# Patient Record
Sex: Female | Born: 1935
Health system: Southern US, Community
[De-identification: ages and names within clinical notes are randomized; demographics above are authoritative.]

## PROBLEM LIST (undated history)

## (undated) DIAGNOSIS — M1990 Unspecified osteoarthritis, unspecified site: Secondary | ICD-10-CM

## (undated) DIAGNOSIS — Z923 Personal history of irradiation: Secondary | ICD-10-CM

## (undated) DIAGNOSIS — N63 Unspecified lump in unspecified breast: Secondary | ICD-10-CM

## (undated) DIAGNOSIS — D689 Coagulation defect, unspecified: Secondary | ICD-10-CM

## (undated) DIAGNOSIS — C50919 Malignant neoplasm of unspecified site of unspecified female breast: Secondary | ICD-10-CM

## (undated) DIAGNOSIS — H269 Unspecified cataract: Secondary | ICD-10-CM

## (undated) DIAGNOSIS — I10 Essential (primary) hypertension: Secondary | ICD-10-CM

## (undated) DIAGNOSIS — M810 Age-related osteoporosis without current pathological fracture: Secondary | ICD-10-CM

## (undated) HISTORY — DX: Malignant neoplasm of unspecified site of unspecified female breast: C50.919

## (undated) HISTORY — DX: Essential (primary) hypertension: I10

## (undated) HISTORY — DX: Unspecified cataract: H26.9

## (undated) HISTORY — DX: Coagulation defect, unspecified: D68.9

## (undated) HISTORY — DX: Unspecified osteoarthritis, unspecified site: M19.90

## (undated) HISTORY — PX: EYE SURGERY: SHX253

## (undated) HISTORY — DX: Age-related osteoporosis without current pathological fracture: M81.0

---

## 2004-04-20 HISTORY — PX: OTHER SURGICAL HISTORY: SHX169

## 2016-12-08 ENCOUNTER — Encounter: Payer: Self-pay | Admitting: Family

## 2016-12-08 ENCOUNTER — Ambulatory Visit (INDEPENDENT_AMBULATORY_CARE_PROVIDER_SITE_OTHER): Payer: Medicaid Other | Admitting: Family

## 2016-12-08 VITALS — BP 141/54 | HR 64 | Temp 98.4°F | Ht 59.0 in | Wt 104.0 lb

## 2016-12-08 DIAGNOSIS — M5410 Radiculopathy, site unspecified: Secondary | ICD-10-CM

## 2016-12-08 DIAGNOSIS — I10 Essential (primary) hypertension: Secondary | ICD-10-CM

## 2016-12-08 DIAGNOSIS — E785 Hyperlipidemia, unspecified: Secondary | ICD-10-CM

## 2016-12-08 DIAGNOSIS — Z86718 Personal history of other venous thrombosis and embolism: Secondary | ICD-10-CM | POA: Diagnosis not present

## 2016-12-08 DIAGNOSIS — H9191 Unspecified hearing loss, right ear: Secondary | ICD-10-CM

## 2016-12-08 DIAGNOSIS — H9190 Unspecified hearing loss, unspecified ear: Secondary | ICD-10-CM

## 2016-12-08 LAB — COMPREHENSIVE METABOLIC PANEL
ALBUMIN: 3.7 g/dL (ref 3.5–5.2)
ALK PHOS: 53 U/L (ref 39–117)
ALT: 11 U/L (ref 0–35)
AST: 19 U/L (ref 0–37)
BUN: 15 mg/dL (ref 6–23)
CALCIUM: 9.5 mg/dL (ref 8.4–10.5)
CO2: 30 mEq/L (ref 19–32)
CREATININE: 0.71 mg/dL (ref 0.40–1.20)
Chloride: 98 mEq/L (ref 96–112)
GFR: 83.17 mL/min (ref >60.00)
Glucose, Bld: 92 mg/dL (ref 70–99)
POTASSIUM: 4.3 meq/L (ref 3.5–5.1)
SODIUM: 131 meq/L — AB (ref 135–145)
TOTAL PROTEIN: 7.8 g/dL (ref 6.0–8.3)
Total Bilirubin: 0.5 mg/dL (ref 0.2–1.2)

## 2016-12-08 LAB — LIPID PANEL
CHOLESTEROL: 126 mg/dL (ref 0–200)
HDL: 56.5 mg/dL (ref >39.00)
LDL Cholesterol: 56 mg/dL (ref 0–99)
NonHDL: 69.71
Total CHOL/HDL Ratio: 2
Triglycerides: 70 mg/dL (ref 0.0–149.0)
VLDL: 14 mg/dL (ref 0.0–40.0)

## 2016-12-08 NOTE — Progress Notes (Signed)
Subjective:    Patient ID: Tanya Sparks, female    DOB: 1930-03-08, 83 y.o.   MRN: [id number]  HPI  Tanya Sparks is an 83 yr old female who moved here from [place] recently. She lives with her Tanya Sparks.  HTN- maintained on Hyzaar.  Hearing difficulty. Has trouble hearing television and conversation.    Grandson reports that when pt ws in [place] she had a "clot" in her head.  Reports that she has been taking a baby aspirin. This occurred in 2004. It almost sounds like a temporal artery clot. She states that it was not a stroke.    Cataracts- had removal 1991 and 1993  Reports fall in 2011 and injured her back at that time.   Review of Systems  Constitutional: Negative for unexpected weight change.  HENT: Positive for hearing loss and rhinorrhea.   Eyes: Negative for visual disturbance.  Respiratory: Negative for cough.   Cardiovascular: Negative for leg swelling.  Gastrointestinal: Negative for diarrhea.       Occasional constipation  Genitourinary: Negative for dysuria and frequency.  Musculoskeletal: Negative for arthralgias and myalgias.       Reports some tingling left anterior thigh.  Has some right sided back pain.  biofreeze roll on helps  Skin: Negative for rash.  Neurological: Negative for headaches.  Hematological: Negative for adenopathy.  Psychiatric/Behavioral:       Denies depression       Past Medical History:  Diagnosis Date  . Arthritis   . Cataract   . Clotting disorder ([place])   . Hypertension      Social History   Social History  . Marital status: Widowed    Spouse name: N/A  . Number of children: N/A  . Years of education: N/A   Occupational History  . Not on file.   Social History Main Topics  . Smoking status: Never Smoker  . Smokeless tobacco: Never Used  . Alcohol use No  . Drug use: No  . Sexual activity: No   Other Topics Concern  . Not on file   Social History Narrative   4 grown children   Widowed   Retired  housewife   Completed 5th grade in [place], she is literate in her native language   Lives with grandson, his wife and their daughter   No pets.    Past Surgical History:  Procedure Laterality Date  . clot removal Right 2006   Pt states she had a blood clot removed from her temple about 12 years ago.  Marland Kitchen EYE SURGERY      Family History  Problem Relation Age of Onset  . Diabetes Father     Not on File  No current outpatient prescriptions on file prior to visit.   No current facility-administered medications on file prior to visit.     BP (!) 141/54 (Cuff Size: Normal)   Pulse 64   Temp 98.4 F (36.9 C) (Oral)   Ht 4\' 11"  (1.499 m)   Wt 104 lb (47.2 kg)   SpO2 98%   BMI 21.01 kg/m    Objective:   Physical Exam  Constitutional: She is oriented to person, place, and time. She appears well-developed and well-nourished.  HENT:  Head: Normocephalic and atraumatic.  Eyes: No scleral icterus.  Cardiovascular: Normal rate, regular rhythm and normal heart sounds.   No murmur heard. Pulmonary/Chest: Effort normal and breath sounds normal. No respiratory distress. She has no wheezes.  Musculoskeletal: She exhibits no edema.  Neurological: She is alert and oriented to person, place, and time.  Skin: Skin is warm and dry.  Psychiatric: She has a normal mood and affect. Her behavior is normal. Judgment and thought content normal.          Assessment & Plan:  Hearing problem- will refer to audiology.   Back pain with radiculopathy- has mild tingling. Advised prn tylenol.  OK to use biofreeze prn.

## 2016-12-08 NOTE — Assessment & Plan Note (Signed)
bp at goal for her age on hyzaar. Continue same, obtain follow up bmet.

## 2016-12-08 NOTE — Patient Instructions (Addendum)
Please complete lab work prior to leaving.  Welcome to [facility]! 

## 2017-03-15 ENCOUNTER — Ambulatory Visit (INDEPENDENT_AMBULATORY_CARE_PROVIDER_SITE_OTHER): Payer: Medicaid Other | Admitting: Family

## 2017-03-15 ENCOUNTER — Encounter: Payer: Self-pay | Admitting: Family

## 2017-03-15 VITALS — BP 150/65 | HR 76 | Temp 98.6°F | Resp 16 | Ht 59.0 in | Wt 104.6 lb

## 2017-03-15 DIAGNOSIS — E871 Hypo-osmolality and hyponatremia: Secondary | ICD-10-CM

## 2017-03-15 DIAGNOSIS — Z0000 Encounter for general adult medical examination without abnormal findings: Secondary | ICD-10-CM

## 2017-03-15 DIAGNOSIS — E348 Other specified endocrine disorders: Secondary | ICD-10-CM

## 2017-03-15 DIAGNOSIS — N63 Unspecified lump in unspecified breast: Secondary | ICD-10-CM

## 2017-03-15 NOTE — Progress Notes (Signed)
Subjective:    Patient ID: Tanya Sparks First, female    DOB: [birth year], 83 y.o.   MRN: [id number]  HPI  Patient presents today for complete physical.  Immunizations: flu shot end of September, reports tetanus <10 years, prevnar today Diet: healthy, vegetarian Exercise:walks frequently Colonoscopy: declines work up due to advanced age Dexa: due Pap Smear: N/A due to advanced age 61: concerned about a lump in her left breast.    Yolanda Bonine helps with translation.  Patient reports that she has had a mass in the left breast for some time, "it doesn't hurt."    Review of Systems  Constitutional: Negative for unexpected weight change.  HENT: Positive for hearing loss. Negative for rhinorrhea.        Considering hearing aids  Eyes: Negative for visual disturbance.  Respiratory: Negative for cough.   Cardiovascular: Negative for leg swelling.  Gastrointestinal: Negative for blood in stool, constipation and diarrhea.  Genitourinary: Negative for dysuria and frequency.  Musculoskeletal:       Occasional knee pain  Skin: Negative for rash.  Neurological: Negative for headaches.  Hematological: Negative for adenopathy.  Psychiatric/Behavioral:       Denies depression/anxiety   Past Medical History:  Diagnosis Date  . Arthritis   . Cataract   . Clotting disorder ([place])   . Hypertension      Social History   Socioeconomic History  . Marital status: Widowed    Spouse name: Not on file  . Number of children: Not on file  . Years of education: Not on file  . Highest education level: Not on file  Social Needs  . Financial resource strain: Not on file  . Food insecurity - worry: Not on file  . Food insecurity - inability: Not on file  . Transportation needs - medical: Not on file  . Transportation needs - non-medical: Not on file  Occupational History  . Not on file  Tobacco Use  . Smoking status: Never Smoker  . Smokeless tobacco: Never Used  Substance and Sexual  Activity  . Alcohol use: No  . Drug use: No  . Sexual activity: No  Other Topics Concern  . Not on file  Social History Narrative   4 grown children   Widowed   Retired housewife   Completed 5th grade in [place], she is literate in her native language   Lives with grandson, his wife and their daughter   No pets.    Past Surgical History:  Procedure Laterality Date  . clot removal Right 2006   Pt states she had a blood clot removed from her temple about 12 years ago.  Marland Kitchen EYE SURGERY      Family History  Problem Relation Age of Onset  . Diabetes Father     Not on File  Current Outpatient Medications on File Prior to Visit  Medication Sig Dispense Refill  . aspirin (ECOTRIN LOW STRENGTH) 81 MG EC tablet Take 81 mg by mouth daily. Swallow whole.    . losartan-hydrochlorothiazide (HYZAAR) 50-12.5 MG tablet Take 1 tablet by mouth daily.    . Multiple Vitamin (MULTIVITAMIN WITH MINERALS) TABS tablet Take 1 tablet by mouth daily.     No current facility-administered medications on file prior to visit.     BP (!) 168/50 (BP Location: Left Arm, Patient Position: Sitting, Cuff Size: Small)   Pulse 76   Temp 98.6 F (37 C) (Oral)   Resp 16   Ht 4\' 11"  (1.499 m)  Wt 104 lb 9.6 oz (47.4 kg)   SpO2 99%   BMI 21.13 kg/m       Objective:   Physical Exam Physical Exam  Constitutional: She is oriented to person, place, and time. She appears well-developed and well-nourished. No distress.  HENT:  Head: Normocephalic and atraumatic.  Right Ear: Tympanic membrane and ear canal normal.  Left Ear: Tympanic membrane and ear canal normal.  Mouth/Throat: Oropharynx is clear and moist.  Eyes: Pupils are equal, round, and reactive to light. No scleral icterus.  Neck: Normal range of motion. No thyromegaly present.  Cardiovascular: Normal rate and regular rhythm.   No murmur heard. Pulmonary/Chest: Effort normal and breath sounds normal. No respiratory distress. He has no wheezes. She  has no rales. She exhibits no tenderness.  Abdominal: Soft. Bowel sounds are normal. She exhibits no distension and no mass. There is no tenderness. There is no rebound and no guarding.  Musculoskeletal: She exhibits no edema.  Lymphadenopathy:    She has no cervical adenopathy.  Neurological: She is alert and oriented to person, place, and time. She has normal patellar reflexes. She exhibits normal muscle tone. Coordination normal.  Skin: Skin is warm and dry.  Psychiatric: She has a normal mood and affect. Her behavior is normal. Judgment and thought content normal.  Breasts: Examined lying Right: Without masses, retractions, discharge or axillary adenopathy.  Left: +mass 12 o'clock semi-mobile, non-tender, approx 2 inches in diameter  retractions, discharge or axillary adenopathy.            Assessment & Plan:   Preventative care- Prevnar today. Flu shot and tetanus up to date (grandson will obtain date of tetanus).  Refer for bone density.   Breast mass-refer for diagnostic mammogram and [place] for further evaluation. Concerning on exam for malignancy given age and lack of previous screening mammograms.   Hyponatremia- obtain follow up cmet.        Assessment & Plan:

## 2017-03-15 NOTE — Patient Instructions (Addendum)
Please schedule bone density in the imaging department on the first floor. You should be contacted about the breast imaging.   Complete lab work prior to leaving.

## 2017-03-16 LAB — COMPREHENSIVE METABOLIC PANEL
ALK PHOS: 57 U/L (ref 39–117)
ALT: 11 U/L (ref 0–35)
AST: 19 U/L (ref 0–37)
Albumin: 4 g/dL (ref 3.5–5.2)
BUN: 10 mg/dL (ref 6–23)
CO2: 29 meq/L (ref 19–32)
Calcium: 9.9 mg/dL (ref 8.4–10.5)
Chloride: 100 mEq/L (ref 96–112)
Creatinine, Ser: 0.65 mg/dL (ref 0.40–1.20)
GFR: 92.03 mL/min (ref >60.00)
GLUCOSE: 96 mg/dL (ref 70–99)
POTASSIUM: 4.6 meq/L (ref 3.5–5.1)
SODIUM: 136 meq/L (ref 135–145)
TOTAL PROTEIN: 7.5 g/dL (ref 6.0–8.3)
Total Bilirubin: 0.3 mg/dL (ref 0.2–1.2)

## 2017-03-18 ENCOUNTER — Ambulatory Visit (HOSPITAL_BASED_OUTPATIENT_CLINIC_OR_DEPARTMENT_OTHER)
Admission: RE | Admit: 2017-03-18 | Discharge: 2017-03-18 | Disposition: A | Discharge status: Home / Self Care | Payer: Medicaid Other | Source: Ambulatory Visit | Attending: Family | Admitting: Family

## 2017-03-18 ENCOUNTER — Telehealth: Payer: Self-pay | Admitting: Family

## 2017-03-18 ENCOUNTER — Encounter: Payer: Self-pay | Admitting: Family

## 2017-03-18 DIAGNOSIS — M810 Age-related osteoporosis without current pathological fracture: Secondary | ICD-10-CM

## 2017-03-18 DIAGNOSIS — E348 Other specified endocrine disorders: Secondary | ICD-10-CM | POA: Insufficient documentation

## 2017-03-18 DIAGNOSIS — Z13820 Encounter for screening for osteoporosis: Secondary | ICD-10-CM | POA: Insufficient documentation

## 2017-03-18 HISTORY — DX: Age-related osteoporosis without current pathological fracture: M81.0

## 2017-03-18 MED ORDER — ALENDRONATE SODIUM 70 MG PO TABS: 70.0000 mg | ORAL_TABLET | ORAL | 11 refills | Status: AC

## 2017-03-18 MED ORDER — CALCIUM CARBONATE-VITAMIN D 600-400 MG-UNIT PO TABS: 1.0000 | ORAL_TABLET | Freq: Two times a day (BID) | ORAL | Status: AC

## 2017-03-18 MED FILL — ALENDRONATE NA 70 MG TAB: 70 | 28 days supply | Qty: 4 | Fill #0

## 2017-03-18 NOTE — Telephone Encounter (Signed)
Bone density shows rather severe osteoporosis.  I would like her to start weekly fosamax, sit up for 90 minutes after taking.  Add caltrate 600mg  + D bid and return to lab at her convenience for vit d testing. Dx osteoporosis. Please notify grandson.

## 2017-03-19 NOTE — Telephone Encounter (Signed)
Results given to patient's grandson, he will bring her in on Monday for Vit D check up, Appointment was scheduled and order entered.

## 2017-03-19 NOTE — Addendum Note (Signed)
Addended by: Jiles Prows on: 03/19/2017 10:27 AM   Modules accepted: Orders

## 2017-03-23 ENCOUNTER — Other Ambulatory Visit (INDEPENDENT_AMBULATORY_CARE_PROVIDER_SITE_OTHER): Payer: Medicaid Other

## 2017-03-23 DIAGNOSIS — M810 Age-related osteoporosis without current pathological fracture: Secondary | ICD-10-CM | POA: Diagnosis not present

## 2017-03-26 LAB — VITAMIN D 1,25 DIHYDROXY
Vitamin D 1, 25 (OH)2 Total: 33 pg/mL (ref 18–72)
Vitamin D2 1, 25 (OH)2: <8 pg/mL
Vitamin D3 1, 25 (OH)2: 33 pg/mL

## 2017-03-30 ENCOUNTER — Ambulatory Visit
Admission: RE | Admit: 2017-03-30 | Discharge: 2017-03-30 | Disposition: A | Discharge status: Home / Self Care | Payer: Medicaid Other | Source: Ambulatory Visit | Attending: Family | Admitting: Family

## 2017-03-30 ENCOUNTER — Other Ambulatory Visit: Payer: Self-pay | Admitting: Family

## 2017-03-30 DIAGNOSIS — N63 Unspecified lump in unspecified breast: Secondary | ICD-10-CM

## 2017-03-30 DIAGNOSIS — N6320 Unspecified lump in the left breast, unspecified quadrant: Secondary | ICD-10-CM

## 2017-03-30 HISTORY — DX: Unspecified lump in unspecified breast: N63.0

## 2017-03-31 ENCOUNTER — Telehealth: Payer: Self-pay | Admitting: Family

## 2017-03-31 NOTE — Telephone Encounter (Signed)
Copied from [place] [id number]. Topic: Inquiry >> Mar 30, 2017  2:20 PM Ether Griffins B wrote: Reason for CRM: pt is scheduled for mammo on 12/11. [facility] says they dont have an order and they are wanting to clarify what all needs to be done.   Reviewed Epic, pt completed ordered mammo and [place] yesterday. Orders were placed on 03/15/17.

## 2017-04-05 ENCOUNTER — Other Ambulatory Visit: Payer: Self-pay | Admitting: Family

## 2017-04-05 DIAGNOSIS — N6320 Unspecified lump in the left breast, unspecified quadrant: Secondary | ICD-10-CM

## 2017-04-06 ENCOUNTER — Ambulatory Visit
Admission: RE | Admit: 2017-04-06 | Discharge: 2017-04-06 | Disposition: A | Discharge status: Home / Self Care | Payer: Medicaid Other | Source: Ambulatory Visit | Attending: Family | Admitting: Family

## 2017-04-06 DIAGNOSIS — N6320 Unspecified lump in the left breast, unspecified quadrant: Secondary | ICD-10-CM

## 2017-04-19 ENCOUNTER — Encounter: Payer: Self-pay | Admitting: Family

## 2017-04-19 ENCOUNTER — Ambulatory Visit: Payer: Medicaid Other | Admitting: Family

## 2017-04-19 VITALS — BP 164/61 | HR 74 | Temp 97.7°F | Resp 14 | Ht 59.0 in | Wt 104.0 lb

## 2017-04-19 DIAGNOSIS — Z862 Personal history of diseases of the blood and blood-forming organs and certain disorders involving the immune mechanism: Secondary | ICD-10-CM | POA: Diagnosis not present

## 2017-04-19 DIAGNOSIS — C50912 Malignant neoplasm of unspecified site of left female breast: Secondary | ICD-10-CM | POA: Diagnosis not present

## 2017-04-19 DIAGNOSIS — C50212 Malignant neoplasm of upper-inner quadrant of left female breast: Secondary | ICD-10-CM | POA: Insufficient documentation

## 2017-04-19 DIAGNOSIS — Z170 Estrogen receptor positive status [ER+]: Secondary | ICD-10-CM

## 2017-04-19 DIAGNOSIS — I10 Essential (primary) hypertension: Secondary | ICD-10-CM | POA: Diagnosis not present

## 2017-04-19 MED ORDER — AMLODIPINE BESYLATE 5 MG PO TABS: 5.0000 mg | ORAL_TABLET | Freq: Every day | ORAL | 3 refills | Status: DC

## 2017-04-19 MED FILL — AMLODIPINE BESYLATE 5 MG TA: 5 | 30 days supply | Qty: 30 | Fill #0

## 2017-04-19 NOTE — Patient Instructions (Signed)
Please begin amlodipine 5mg  once daily for blood pressure. Keep your upcoming appointment with Dr. Marlou Starks (surgeon).

## 2017-04-19 NOTE — Progress Notes (Signed)
Subjective:    Patient ID: Tanya Sparks, female    DOB: [birth year], 83 y.o.   MRN: [id number]  HPI  Tanya Sparks is an 83 yr old female who presents today for follow up.  1) HTN- continues hyzaar.   BP Readings from Last 3 Encounters:  04/19/17 (!) 164/61  03/15/17 (!) 150/65  12/08/16 (!) 141/54   2) Breast mass- underwent diagnostic imaging and biopsy. 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  Has appointment with Dr. Marlou Starks (breast surgeon) on 04/21/17 to discuss plan.    Review of Systems    see HPI  Past Medical History:  Diagnosis Date  . Arthritis   . Breast mass   . Cataract   . Clotting disorder ([place])   . Hypertension   . Osteoporosis 03/18/2017     Social History   Socioeconomic History  . Marital status: Widowed    Spouse name: Not on file  . Number of children: Not on file  . Years of education: Not on file  . Highest education level: Not on file  Social Needs  . Financial resource strain: Not on file  . Food insecurity - worry: Not on file  . Food insecurity - inability: Not on file  . Transportation needs - medical: Not on file  . Transportation needs - non-medical: Not on file  Occupational History  . Not on file  Tobacco Use  . Smoking status: Never Smoker  . Smokeless tobacco: Never Used  Substance and Sexual Activity  . Alcohol use: No  . Drug use: No  . Sexual activity: No  Other Topics Concern  . Not on file  Social History Narrative   4 grown children   Widowed   Retired housewife   Completed 5th grade in [place], she is literate in her native language   Lives with grandson, his wife and their daughter   No pets.    Past Surgical History:  Procedure Laterality Date  . clot removal Right 2006   Pt states she had a blood clot removed from her temple about 12 years ago.  Marland Kitchen EYE SURGERY      Family History  Problem Relation Age of Onset  . Diabetes Father     No  Known Allergies  Current Outpatient Medications on File Prior to Visit  Medication Sig Dispense Refill  . alendronate (FOSAMAX) 70 MG tablet Take 1 tablet (70 mg total) by mouth every 7 (seven) days. Take with a full glass of water on an empty stomach. 4 tablet 11  . aspirin (ECOTRIN LOW STRENGTH) 81 MG EC tablet Take 81 mg by mouth daily. Swallow whole.    . losartan-hydrochlorothiazide (HYZAAR) 50-12.5 MG tablet Take 1 tablet by mouth daily.    . Multiple Vitamin (MULTIVITAMIN WITH MINERALS) TABS tablet Take 1 tablet by mouth daily.    . Calcium Carbonate-Vitamin D (CALTRATE 600+D) 600-400 MG-UNIT tablet Take 1 tablet by mouth 2 (two) times daily. (Patient not taking: Reported on 04/19/2017)     No current facility-administered medications on file prior to visit.     BP (!) 164/61 (BP Location: Right Arm, Cuff Size: Normal)   Pulse 74   Temp 97.7 F (36.5 C) (Oral)   Resp 14   Ht 4\' 11"  (1.499 m)   Wt 104 lb (47.2 kg)   SpO2 99%   BMI 21.01 kg/m    Objective:   Physical Exam  Constitutional: She is  oriented to person, place, and time. She appears well-developed and well-nourished.  Cardiovascular: Normal rate, regular rhythm and normal heart sounds.  No murmur heard. Pulmonary/Chest: Effort normal and breath sounds normal. No respiratory distress. She has no wheezes.  Neurological: She is alert and oriented to person, place, and time.  Psychiatric: She has a normal mood and affect. Her behavior is normal. Judgment and thought content normal.          Assessment & Plan:  Has hx of clotting disorder, advised pt OK to restart aspirin.  Breast cancer- new diagnosis- work up and plan is ongoing.  HTN- uncontrolled.  Add amlodipine once daily. Follow up in 2 weeks for nurse visit.

## 2017-04-21 ENCOUNTER — Ambulatory Visit: Payer: Self-pay | Admitting: General Surgery

## 2017-04-23 MED FILL — ALENDRONATE NA 70 MG TAB: 70 | 28 days supply | Qty: 4 | Fill #1

## 2017-04-28 ENCOUNTER — Telehealth: Payer: Self-pay | Admitting: Hematology and Oncology

## 2017-04-28 ENCOUNTER — Encounter: Payer: Self-pay | Admitting: Adult Health

## 2017-04-28 NOTE — Telephone Encounter (Signed)
Spoke with patients grandson regarding her appointment D/T/Loc/Ph#

## 2017-05-03 NOTE — Progress Notes (Signed)
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  Receptor Status: ER(100%), PR (30%), Her2-neu (NEG), Ki-(10%)  Did patient present with symptoms or was this found on screening mammography?: She presented to Dr. Marlou Starks on 04/21/17 with a palpable upper inner Left Breast mass that has been present for 5 months.   Past/Anticipated interventions by surgeon, if any: Dr. Marlou Starks 05/13/17 surgery scheduled.   Past/Anticipated interventions by medical oncology, if any:  Dr. Lindi Adie 05/06/17 Recommendations: 1. Breast conserving surgery followed by 2. +/- Adjuvant radiation therapy  3. Adjuvant antiestrogen therapy with letrozole 2.5 mg daily X 5 years   Lymphedema issues, if any:  N/A  Pain issues, if any:  She has some soreness over her Left Breast area.   SAFETY ISSUES:  Prior radiation? No  Pacemaker/ICD? No  Possible current pregnancy? No  Is the patient on methotrexate? No  Current Complaints / other details:    BP (!) 142/56   Pulse 74   Temp 97.8 F (36.6 C)   Ht 4' 11"  (1.499 m)   Wt 104 lb 9.6 oz (47.4 kg)   SpO2 100% Comment: room air  BMI 21.13 kg/m    Wt Readings from Last 3 Encounters:  05/07/17 104 lb 9.6 oz (47.4 kg)  05/06/17 102 lb 3.2 oz (46.4 kg)  04/19/17 104 lb (47.2 kg)      Varnell Donate, Stephani Police, RN 05/03/2017,11:12 AM

## 2017-05-05 ENCOUNTER — Ambulatory Visit: Payer: Medicaid Other

## 2017-05-06 ENCOUNTER — Inpatient Hospital Stay: Payer: Medicaid Other | Attending: Hematology and Oncology | Admitting: Hematology and Oncology

## 2017-05-06 DIAGNOSIS — I10 Essential (primary) hypertension: Secondary | ICD-10-CM | POA: Insufficient documentation

## 2017-05-06 DIAGNOSIS — Z923 Personal history of irradiation: Secondary | ICD-10-CM

## 2017-05-06 DIAGNOSIS — M129 Arthropathy, unspecified: Secondary | ICD-10-CM | POA: Diagnosis not present

## 2017-05-06 DIAGNOSIS — Z79899 Other long term (current) drug therapy: Secondary | ICD-10-CM | POA: Diagnosis not present

## 2017-05-06 DIAGNOSIS — M810 Age-related osteoporosis without current pathological fracture: Secondary | ICD-10-CM | POA: Diagnosis not present

## 2017-05-06 DIAGNOSIS — Z170 Estrogen receptor positive status [ER+]: Secondary | ICD-10-CM | POA: Insufficient documentation

## 2017-05-06 DIAGNOSIS — Z7982 Long term (current) use of aspirin: Secondary | ICD-10-CM | POA: Diagnosis not present

## 2017-05-06 DIAGNOSIS — C50212 Malignant neoplasm of upper-inner quadrant of left female breast: Secondary | ICD-10-CM | POA: Insufficient documentation

## 2017-05-06 NOTE — Progress Notes (Signed)
[facility] CONSULT NOTE  Patient Care Team: [name], NP as PCP - General (Internal Medicine)  CHIEF COMPLAINTS/PURPOSE OF CONSULTATION:  Newly diagnosed breast cancer  HISTORY OF PRESENTING ILLNESS:  Tanya Sparks 84 y.o. female is here because of recent diagnosis of left breast cancer.  Patient felt a lump in the left breast and brought to the attention of her family who brought her to the primary care physician.  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  She was referred to [place] for discussion regarding treatment options.  She is a very fit healthy 84 year old who walks several miles every day and stays very active.  Her grandson brought her into the clinic.  She speaks [place].  I reviewed her records extensively and collaborated the history with the patient.  SUMMARY OF ONCOLOGIC HISTORY:   Malignant neoplasm of upper-inner quadrant of left breast in female, estrogen receptor positive ([place])   04/06/2017 Initial Diagnosis    Left breast UIQ spiculated mass 2.2 x 1.4 x 2 cm, 5 cm from the nipple, no axillary nodes, biopsy revealed IDC with DCIS grade 1-2, ER 100%, PR 30%, Ki-67 10%, HER-2 negative ratio 1.54, T2 N0 stage Ib clinical stage       MEDICAL HISTORY:  Past Medical History:  Diagnosis Date  . Arthritis   . Breast cancer ([place])    INVASIVE MAMMARY CARCINOMA WITH EXTRACELLULAR MUCIN.  Marland Kitchen Breast mass   . Cataract   . Clotting disorder ([place])   . Hypertension   . Osteoporosis 03/18/2017    SURGICAL HISTORY: Past Surgical History:  Procedure Laterality Date  . clot removal Right 2006   Pt states she had a blood clot removed from her temple about 12 years ago.  Marland Kitchen EYE SURGERY      SOCIAL HISTORY: Social History   Socioeconomic History  . Marital status: Widowed     Spouse name: Not on file  . Number of children: Not on file  . Years of education: Not on file  . Highest education level: Not on file  Social Needs  . Financial resource strain: Not on file  . Food insecurity - worry: Not on file  . Food insecurity - inability: Not on file  . Transportation needs - medical: Not on file  . Transportation needs - non-medical: Not on file  Occupational History  . Not on file  Tobacco Use  . Smoking status: Never Smoker  . Smokeless tobacco: Never Used  Substance and Sexual Activity  . Alcohol use: No  . Drug use: No  . Sexual activity: No  Other Topics Concern  . Not on file  Social History Narrative   4 grown children   Widowed   Retired housewife   Completed 5th grade in [place], she is literate in her native language   Lives with grandson, his wife and their daughter   No pets.    FAMILY HISTORY: Family History  Problem Relation Age of Onset  . Diabetes Father     ALLERGIES:  has No Known Allergies.  MEDICATIONS:  Current Outpatient Medications  Medication Sig Dispense Refill  . alendronate (FOSAMAX) 70 MG tablet Take 1 tablet (70 mg total) by mouth every 7 (seven) days. Take with a full glass of water on an empty stomach. (Patient  taking differently: Take 70 mg by mouth every Saturday. Take with a full glass of water on an empty stomach.) 4 tablet 11  . amLODipine (NORVASC) 5 MG tablet Take 1 tablet (5 mg total) by mouth daily. 30 tablet 3  . aspirin (ECOTRIN LOW STRENGTH) 81 MG EC tablet Take 81 mg by mouth daily. Swallow whole.    . Calcium Carbonate-Vitamin D (CALTRATE 600+D) 600-400 MG-UNIT tablet Take 1 tablet by mouth 2 (two) times daily.    Marland Kitchen losartan-hydrochlorothiazide (HYZAAR) 50-12.5 MG tablet Take 1 tablet by mouth daily.    . Multiple Vitamin (MULTIVITAMIN WITH MINERALS) TABS tablet Take 1 tablet by mouth daily.    Marland Kitchen PRESCRIPTION MEDICATION Place 1 drop into both eyes at bedtime. DuoTrav : Travatan and Timolol     No  current facility-administered medications for this visit.     REVIEW OF SYSTEMS:   Constitutional: Denies fevers, chills or abnormal night sweats Eyes: Denies blurriness of vision, double vision or watery eyes Ears, nose, mouth, throat, and face: Denies mucositis or sore throat Respiratory: Denies cough, dyspnea or wheezes Cardiovascular: Denies palpitation, chest discomfort or lower extremity swelling Gastrointestinal:  Denies nausea, heartburn or change in bowel habits Skin: Denies abnormal skin rashes Lymphatics: Denies new lymphadenopathy or easy bruising Neurological:Denies numbness, tingling or new weaknesses Behavioral/Psych: Mood is stable, no new changes  Breast: Palpable lump in the left breast All other systems were reviewed with the patient and are negative.  PHYSICAL EXAMINATION: ECOG PERFORMANCE STATUS: 1 - Symptomatic but completely ambulatory  Vitals:   05/06/17 1551  BP: (!) 168/64  Pulse: 79  Resp: 16  Temp: (!) 97.5 F (36.4 C)  SpO2: 91%   Filed Weights   05/06/17 1551  Weight: 102 lb 3.2 oz (46.4 kg)    GENERAL:alert, no distress and comfortable SKIN: skin color, texture, turgor are normal, no rashes or significant lesions EYES: normal, conjunctiva are pink and non-injected, sclera clear OROPHARYNX:no exudate, no erythema and lips, buccal mucosa, and tongue normal  NECK: supple, thyroid normal size, non-tender, without nodularity LYMPH:  no palpable lymphadenopathy in the cervical, axillary or inguinal LUNGS: clear to auscultation and percussion with normal breathing effort HEART: regular rate & rhythm and no murmurs and no lower extremity edema ABDOMEN:abdomen soft, non-tender and normal bowel sounds Musculoskeletal:no cyanosis of digits and no clubbing  PSYCH: alert & oriented x 3 with fluent speech NEURO: no focal motor/sensory deficits BREAST: 2-3 cm palpable lump in the left breast superior aspect. No palpable axillary or supraclavicular  lymphadenopathy (exam performed in the presence of a chaperone)   LABORATORY DATA:  I have reviewed the data as listed No results found for: WBC, HGB, HCT, MCV, PLT Lab Results  Component Value Date   NA 136 03/15/2017   K 4.6 03/15/2017   CL 100 03/15/2017   CO2 29 03/15/2017    RADIOGRAPHIC STUDIES: I have personally reviewed the radiological reports and agreed with the findings in the report.  ASSESSMENT AND PLAN:  Malignant neoplasm of upper-inner quadrant of left breast in female, estrogen receptor positive ([place]) 04/07/2017: Left breast UIQ spiculated mass 2.2 x 1.4 x 2 cm, 5 cm from the nipple, no axillary nodes, biopsy revealed IDC with DCIS grade 1-2, ER 100%, PR 30%, Ki-67 10%, HER-2 negative ratio 1.54, T2 N0 stage Ib clinical stage  Pathology and radiology counseling:Discussed with the patient, the details of pathology including the type of breast cancer,the clinical staging, the significance of ER, PR and HER-2/neu receptors  and the implications for treatment. After reviewing the pathology in detail, we proceeded to discuss the different treatment options between surgery, radiation, chemotherapy, antiestrogen therapies.  Recommendations: 1. Breast conserving surgery followed by 2. +/- Adjuvant radiation therapy  3. Adjuvant antiestrogen therapy with letrozole 2.5 mg daily X 5 years  Return to clinic after surgery to discuss the final pathology report  All questions were answered. The patient knows to call the clinic with any problems, questions or concerns.    Harriette Ohara, MD 05/06/17

## 2017-05-06 NOTE — Assessment & Plan Note (Signed)
04/07/2017: Left breast UIQ spiculated mass 2.2 x 1.4 x 2 cm, 5 cm from the nipple, no axillary nodes, biopsy revealed IDC with DCIS grade 1-2, ER 100%, PR 30%, Ki-67 10%, HER-2 negative ratio 1.54, T2 N0 stage Ib clinical stage  Pathology and radiology counseling:Discussed with the patient, the details of pathology including the type of breast cancer,the clinical staging, the significance of ER, PR and HER-2/neu receptors and the implications for treatment. After reviewing the pathology in detail, we proceeded to discuss the different treatment options between surgery, radiation, chemotherapy, antiestrogen therapies.  Recommendations: 1. Breast conserving surgery followed by 2. Adjuvant radiation therapy followed by 3. Adjuvant antiestrogen therapy with letrozole 2.5 mg daily X 5 years  Return to clinic after surgery to discuss the final pathology report

## 2017-05-07 ENCOUNTER — Ambulatory Visit
Admission: RE | Admit: 2017-05-07 | Discharge: 2017-05-07 | Disposition: A | Discharge status: Home / Self Care | Payer: Medicaid Other | Source: Ambulatory Visit | Attending: Radiation Oncology | Admitting: Radiation Oncology

## 2017-05-07 ENCOUNTER — Telehealth: Payer: Self-pay | Admitting: Hematology and Oncology

## 2017-05-07 ENCOUNTER — Encounter: Payer: Self-pay | Admitting: Radiation Oncology

## 2017-05-07 ENCOUNTER — Encounter: Payer: Self-pay | Admitting: *Deleted

## 2017-05-07 VITALS — BP 142/56 | HR 74 | Temp 97.8°F | Ht 59.0 in | Wt 104.6 lb

## 2017-05-07 DIAGNOSIS — Z79899 Other long term (current) drug therapy: Secondary | ICD-10-CM | POA: Diagnosis not present

## 2017-05-07 DIAGNOSIS — Z170 Estrogen receptor positive status [ER+]: Principal | ICD-10-CM

## 2017-05-07 DIAGNOSIS — I10 Essential (primary) hypertension: Secondary | ICD-10-CM | POA: Diagnosis not present

## 2017-05-07 DIAGNOSIS — C50212 Malignant neoplasm of upper-inner quadrant of left female breast: Secondary | ICD-10-CM | POA: Diagnosis present

## 2017-05-07 DIAGNOSIS — F419 Anxiety disorder, unspecified: Secondary | ICD-10-CM | POA: Diagnosis not present

## 2017-05-07 DIAGNOSIS — Z7982 Long term (current) use of aspirin: Secondary | ICD-10-CM | POA: Diagnosis not present

## 2017-05-07 NOTE — Progress Notes (Signed)
Radiation Oncology         [phone] ________________________________  Initial outpatient Consultation  Name: Tanya Sparks MRN: [id number]  Date: 05/07/2017  DOB: 06-14-1929  CC:O'Sullivan, Tanya Sciara, NP  Tanya Kussmaul, MD   REFERRING PHYSICIAN: Autumn Messing III, MD  DIAGNOSIS:    ICD-10-CM   1. Malignant neoplasm of upper-inner quadrant of left breast in female, estrogen receptor positive ([place]) C50.212    Z17.0    Cancer Staging Malignant neoplasm of upper-inner quadrant of left breast in female, estrogen receptor positive ([place]) Staging form: Breast, AJCC 8th Edition - Clinical: Stage IB (cT2, cN0, cM0, G2, ER: Positive, PR: Positive, HER2: Negative) - Unsigned   Stage IB clinical stage, left Breast UIQ Invasive Ductal Carcinoma, ER 100% / PR 30% / Her2 negative, Grade 1-2   CHIEF COMPLAINT: Here to discuss management of left breast cancer  HISTORY OF PRESENT ILLNESS::Tanya Sparks is a 84 y.o. female who presented to Dr. Marlou Starks on 04/21/17 with a palpable upper inner left breast mass that has been present for 5 months . She underwent a mammogram and ultrasound on 04/06/17 which revealed a 2.2 cm left breast mass with grade I-II invasive mammary carcinoma with extracellular mucin, and mammary carcinoma in situ of the left breast, upper inner at the 10:30 o'clock position. Biopsy showed invasive ductal carcinoma with ER and PR positive, Her-2 negative and Ki-67 of 10% with characteristics as described above in the diagnosis. She has no history of heart attacks, or stroke. And no family history of breast cancer, or other cancers.  She is independent and stays active, she lives with her grandson and his family where they assist her at times.   Her grandson translated today.  They declined a ttranslator through the hospital Denied having any headaches, trouble swallowing and breathing. Denied abdominal pain. Reports having an occasion pain on her lower back, expressed she uses a back  belt to help. She reports having a problem with constipation and bruising easily. She expressed that she has anxiety and is scared about her treatment.  PREVIOUS RADIATION THERAPY: No  PAST MEDICAL HISTORY:  has a past medical history of Arthritis, Breast cancer ([place]), Breast mass, Cataract, Clotting disorder ([place]), Hypertension, and Osteoporosis (03/18/2017).    PAST SURGICAL HISTORY: Past Surgical History:  Procedure Laterality Date  . clot removal Right 2006   Pt states she had a blood clot removed from her temple about 12 years ago.  Marland Kitchen EYE SURGERY Bilateral    cataract surgery    FAMILY HISTORY: family history includes Diabetes in her father.  SOCIAL HISTORY:  reports that  has never smoked. she has never used smokeless tobacco. She reports that she does not drink alcohol or use drugs.   Patient lives in [place] with her grandson and family. She is from [place] moved to the [place] in 2011 first residing in [place] and then moved to [place].  ALLERGIES: Patient has no known allergies.  MEDICATIONS:  Current Outpatient Medications  Medication Sig Dispense Refill  . alendronate (FOSAMAX) 70 MG tablet Take 1 tablet (70 mg total) by mouth every 7 (seven) days. Take with a full glass of water on an empty stomach. (Patient taking differently: Take 70 mg by mouth every Saturday. Take with a full glass of water on an empty stomach.) 4 tablet 11  . amLODipine (NORVASC) 5 MG tablet Take 1 tablet (5 mg total) by mouth daily. 30 tablet 3  . aspirin (ECOTRIN LOW STRENGTH) 81 MG EC  tablet Take 81 mg by mouth daily. Swallow whole.    . Calcium Carbonate-Vitamin D (CALTRATE 600+D) 600-400 MG-UNIT tablet Take 1 tablet by mouth 2 (two) times daily.    Marland Kitchen losartan-hydrochlorothiazide (HYZAAR) 50-12.5 MG tablet Take 1 tablet by mouth daily.    . Multiple Vitamin (MULTIVITAMIN WITH MINERALS) TABS tablet Take 1 tablet by mouth daily.    Marland Kitchen PRESCRIPTION MEDICATION Place 1 drop into both eyes at bedtime. DuoTrav :  Travatan and Timolol     No current facility-administered medications for this encounter.     REVIEW OF SYSTEMS: A 10+ POINT REVIEW OF SYSTEMS WAS OBTAINED including neurology, dermatology, psychiatry, cardiac, respiratory, lymph, extremities, GI, GU, Musculoskeletal, constitutional, breasts, reproductive, HEENT.  All pertinent positives are noted in the HPI. Patient mentioned having knee pain regarding her arthritis.    PHYSICAL EXAM:  height is 4' 11"  (1.499 m) and weight is 104 lb 9.6 oz (47.4 kg). Her temperature is 97.8 F (36.6 C). Her blood pressure is 142/56 (abnormal) and her pulse is 74. Her oxygen saturation is 100%.    General: Alert and oriented, in no acute distress HEENT: Head is normocephalic. Extraocular movements are intact. Oropharynx is clear. Neck: Neck is supple, no palpable cervical or supraclavicular lymphadenopathy. Heart: Regular in rate and rhythm with no murmurs, rubs, or gallops. Chest: Clear to auscultation bilaterally, with no rhonchi, wheezes, or rales. Abdomen: Soft, nontender, nondistended, with no rigidity or guarding. Extremities: No cyanosis or edema.  Arthritic changes in her hands Lymphatics: see Neck Exam Skin: Vertical and horizontal scar around her abdomen.  Musculoskeletal: symmetric strength and muscle tone throughout. Neurologic: Cranial nerves II through XII are grossly intact. No obvious focalities. Speech is fluent. Coordination is intact. Psychiatric: Judgment and insight are intact. Affect is appropriate. Breasts: Left breast around the 11 o'clock there is a palpable mass that is about 2.5 cm in greatest dimension.  No palpable axxilary nodes on the left. No other palpable masses appreciated in the right breasts or axilla.    ECOG = 1  0 - Asymptomatic (Fully active, able to carry on all predisease activities without restriction)  1 - Symptomatic but completely ambulatory (Restricted in physically strenuous activity but ambulatory and  able to carry out work of a light or sedentary nature. For example, light housework, office work)  2 - Symptomatic, <50% in bed during the day (Ambulatory and capable of all self care but unable to carry out any work activities. Up and about more than 50% of waking hours)  3 - Symptomatic, >50% in bed, but not bedbound (Capable of only limited self-care, confined to bed or chair 50% or more of waking hours)  4 - Bedbound (Completely disabled. Cannot carry on any self-care. Totally confined to bed or chair)  5 - Death   Eustace Pen MM, Creech RH, Tormey DC, et al. [phone]). "Toxicity and response criteria of the [facility]". [place] Oncol. 5 (6): 649-55   LABORATORY DATA:  No results found for: WBC, HGB, HCT, MCV, PLT CMP     Component Value Date/Time   NA 136 03/15/2017 1649   K 4.6 03/15/2017 1649   CL 100 03/15/2017 1649   CO2 29 03/15/2017 1649   GLUCOSE 96 03/15/2017 1649   BUN 10 03/15/2017 1649   CREATININE 0.65 03/15/2017 1649   CALCIUM 9.9 03/15/2017 1649   PROT 7.5 03/15/2017 1649   ALBUMIN 4.0 03/15/2017 1649   AST 19 03/15/2017 1649   ALT 11  03/15/2017 1649   ALKPHOS 57 03/15/2017 1649   BILITOT 0.3 03/15/2017 1649     RADIOGRAPHY: No results found.    IMPRESSION/PLAN: 84 y.o. women with left breast cancer, ER positive  It was a pleasure meeting the patient today. We discussed the risks, benefits, and side effects of radiotherapy. I recommend radiotherapy to the left breast  to reduce her risk of locoregional recurrence by 2/3.  We discussed that radiation would take approximately 3 1/2 to 4 weeks to complete and that I would give the patient a few weeks to heal following surgery before starting treatment planning. A lumpectomy is scheduled to be on 05/13/17 with Dr. Marlou Starks .  Radiation would follow lumpectomy. We spoke about acute effects including skin irritation and fatigue as well as much less common late effects including internal organ injury or  irritation. We spoke about the latest technology that is used to minimize the risk of late effects for patients undergoing radiotherapy to the breast or chest wall. No guarantees of treatment were given. The patient is enthusiastic about proceeding with treatment. I look forward to participating in the patient's care.  I will await her referral back to me for postoperative follow-up and eventual CT simulation/treatment planning.  Patient expressed having some concern, anxiety and being scared about treatment and disease. I told her that she has a good prognosis and that treatment should run smoothly.  Consent was signed today. __________________________________________   Eppie Gibson, MD   This document serves as a record of services personally performed by Eppie Gibson MD. It was created on her behalf by Delton Coombes, a trained medical scribe. The creation of this record is based on the scribe's personal observations and the provider's statements to them.

## 2017-05-07 NOTE — Patient Instructions (Addendum)
MEHREEN AZIZI  05/07/2017   Your procedure is scheduled on: 05-13-17   Report to [facility] Main  Entrance Follow signs to Short Stay on first floor at 530 AM  Call this number if you have problems the morning of surgery [phone]     Remember: NO SOLID FOOD AFTER MIDNIGHT THE NIGHT PRIOR TO SURGERY. NOTHING BY MOUTH EXCEPT CLEAR LIQUIDS UNTIL 3 HOURS PRIOR TO SCHEDULED SURGERY. PLEASE FINISH ENSURE DRINK PER SURGEON ORDER 3 HOURS PRIOR TO SCHEDULED SURGERY TIME WHICH NEEDS TO BE COMPLETED AT 4:30 AM.     CLEAR LIQUID DIET   Foods Allowed                                                                     Foods Excluded  Coffee and tea, regular and decaf                             liquids that you cannot  Plain Jell-O in any flavor                                             see through such as: Fruit ices (not with fruit pulp)                                     milk, soups, orange juice  Iced Popsicles                                    All solid food Carbonated beverages, regular and diet                                    Cranberry, grape and apple juices Sports drinks like Gatorade Lightly seasoned clear broth or consume(fat free) Sugar, honey syrup  Sample Menu Breakfast                                Lunch                                     Supper Cranberry juice                    Beef broth                            Chicken broth Jell-O                                     Grape juice  Apple juice Coffee or tea                        Jell-O                                      Popsicle                                                Coffee or tea                        Coffee or tea  _____________________________________________________________________     Take these medicines the morning of surgery with A SIP OF WATER: Amlodipine (Norvasc)                                You may not have any metal on your body  including hair pins and              piercings  Do not wear jewelry, make-up, lotions, powders or perfumes, deodorant             Do not wear nail polish.  Do not shave  48 hours prior to surgery.               Do not bring valuables to the hospital. [place].  Contacts, dentures or bridgework may not be worn into surgery.      Patients discharged the day of surgery will not be allowed to drive home.  Name and phone number of your driver: Miliana Gangwer  [phone]                Please read over the following fact sheets you were given: _____________________________________________________________________             [facility] - Preparing for Surgery Before surgery, you can play an important role.  Because skin is not sterile, your skin needs to be as free of germs as possible.  You can reduce the number of germs on your skin by washing with CHG (chlorahexidine gluconate) soap before surgery.  CHG is an antiseptic cleaner which kills germs and bonds with the skin to continue killing germs even after washing. Please DO NOT use if you have an allergy to CHG or antibacterial soaps.  If your skin becomes reddened/irritated stop using the CHG and inform your nurse when you arrive at Short Stay. Do not shave (including legs and underarms) for at least 48 hours prior to the first CHG shower.  You may shave your face/neck. Please follow these instructions carefully:  1.  Shower with CHG Soap the night before surgery and the  morning of Surgery.  2.  If you choose to wash your hair, wash your hair first as usual with your  normal  shampoo.  3.  After you shampoo, rinse your hair and body thoroughly to remove the  shampoo.                           4.  Use CHG as you would any other liquid soap.  You can apply chg directly  to the skin and wash                       Gently with a scrungie or clean washcloth.  5.  Apply the CHG Soap to your  body ONLY FROM THE NECK DOWN.   Do not use on face/ open                           Wound or open sores. Avoid contact with eyes, ears mouth and genitals (private parts).                       Wash face,  Genitals (private parts) with your normal soap.             6.  Wash thoroughly, paying special attention to the area where your surgery  will be performed.  7.  Thoroughly rinse your body with warm water from the neck down.  8.  DO NOT shower/wash with your normal soap after using and rinsing off  the CHG Soap.                9.  Pat yourself dry with a clean towel.            10.  Wear clean pajamas.            11.  Place clean sheets on your bed the night of your first shower and do not  sleep with pets. Day of Surgery : Do not apply any lotions/deodorants the morning of surgery.  Please wear clean clothes to the hospital/surgery center.  FAILURE TO FOLLOW THESE INSTRUCTIONS MAY RESULT IN THE CANCELLATION OF YOUR SURGERY PATIENT SIGNATURE_________________________________  NURSE SIGNATURE__________________________________  ________________________________________________________________________

## 2017-05-07 NOTE — Telephone Encounter (Signed)
No 1/17 los -  

## 2017-05-10 ENCOUNTER — Other Ambulatory Visit: Payer: Self-pay

## 2017-05-10 ENCOUNTER — Encounter (HOSPITAL_COMMUNITY): Payer: Self-pay

## 2017-05-10 ENCOUNTER — Encounter (HOSPITAL_COMMUNITY)
Admission: RE | Admit: 2017-05-10 | Discharge: 2017-05-10 | Disposition: A | Discharge status: Home / Self Care | Payer: Medicaid Other | Source: Ambulatory Visit | Attending: General Surgery | Admitting: General Surgery

## 2017-05-10 DIAGNOSIS — C50912 Malignant neoplasm of unspecified site of left female breast: Secondary | ICD-10-CM | POA: Insufficient documentation

## 2017-05-10 DIAGNOSIS — Z01818 Encounter for other preprocedural examination: Secondary | ICD-10-CM | POA: Insufficient documentation

## 2017-05-10 DIAGNOSIS — I10 Essential (primary) hypertension: Secondary | ICD-10-CM | POA: Diagnosis not present

## 2017-05-10 LAB — BASIC METABOLIC PANEL
ANION GAP: 6 (ref 5–15)
BUN: 12 mg/dL (ref 6–20)
CALCIUM: 9.5 mg/dL (ref 8.9–10.3)
CO2: 28 mmol/L (ref 22–32)
Chloride: 100 mmol/L — ABNORMAL LOW (ref 101–111)
Creatinine, Ser: 0.65 mg/dL (ref 0.44–1.00)
GFR calc Af Amer: >60 mL/min (ref >60)
GFR calc non Af Amer: >60 mL/min (ref >60)
GLUCOSE: 90 mg/dL (ref 65–99)
Potassium: 4 mmol/L (ref 3.5–5.1)
SODIUM: 134 mmol/L — AB (ref 135–145)

## 2017-05-10 LAB — CBC
HEMATOCRIT: 36.6 % (ref 36.0–46.0)
Hemoglobin: 12.5 g/dL (ref 12.0–15.0)
MCH: 30.2 pg (ref 26.0–34.0)
MCHC: 34.2 g/dL (ref 30.0–36.0)
MCV: 88.4 fL (ref 78.0–100.0)
Platelets: 285 10*3/uL (ref 150–400)
RBC: 4.14 MIL/uL (ref 3.87–5.11)
RDW: 13.1 % (ref 11.5–15.5)
WBC: 5.8 10*3/uL (ref 4.0–10.5)

## 2017-05-10 NOTE — Progress Notes (Signed)
Per e-mail from interpreting services, there is no  'in person' Gujarati interpreter for the day of surgery. However contacted [employer], and verified that they have this language service available telephonic.   Contacted pt's grandson, Tanya Sparks, who accompanied his grandmother to her appointment. Advised that language is available telephonic. Per Mr. Zeis, they prefer not to use the language services. Pt's daughter, Tanya Sparks  will be available to interpreter the day of surgery.

## 2017-05-13 ENCOUNTER — Other Ambulatory Visit: Payer: Self-pay

## 2017-05-13 ENCOUNTER — Telehealth: Payer: Self-pay | Admitting: Hematology and Oncology

## 2017-05-13 ENCOUNTER — Ambulatory Visit (HOSPITAL_COMMUNITY)
Admission: RE | Admit: 2017-05-13 | Discharge: 2017-05-13 | Disposition: A | Discharge status: Home / Self Care | Payer: Medicaid Other | Source: Ambulatory Visit | Attending: General Surgery | Admitting: General Surgery

## 2017-05-13 ENCOUNTER — Encounter (HOSPITAL_COMMUNITY): Payer: Self-pay | Admitting: *Deleted

## 2017-05-13 ENCOUNTER — Ambulatory Visit (HOSPITAL_COMMUNITY): Payer: Medicaid Other | Admitting: Anesthesiology

## 2017-05-13 ENCOUNTER — Encounter (HOSPITAL_COMMUNITY)
Admission: RE | Disposition: A | Discharge status: Home / Self Care | Payer: Self-pay | Source: Ambulatory Visit | Attending: General Surgery

## 2017-05-13 DIAGNOSIS — Z170 Estrogen receptor positive status [ER+]: Secondary | ICD-10-CM | POA: Diagnosis not present

## 2017-05-13 DIAGNOSIS — Z79899 Other long term (current) drug therapy: Secondary | ICD-10-CM | POA: Insufficient documentation

## 2017-05-13 DIAGNOSIS — Z7982 Long term (current) use of aspirin: Secondary | ICD-10-CM | POA: Insufficient documentation

## 2017-05-13 DIAGNOSIS — C50212 Malignant neoplasm of upper-inner quadrant of left female breast: Secondary | ICD-10-CM | POA: Diagnosis present

## 2017-05-13 DIAGNOSIS — I10 Essential (primary) hypertension: Secondary | ICD-10-CM | POA: Diagnosis not present

## 2017-05-13 HISTORY — PX: BREAST LUMPECTOMY: SHX2

## 2017-05-13 SURGERY — BREAST LUMPECTOMY
Anesthesia: General | Site: Breast | Laterality: Left

## 2017-05-13 MED ORDER — HYDROMORPHONE HCL 1 MG/ML IJ SOLN
INTRAMUSCULAR | Status: AC
  Filled 2017-05-13: qty 1

## 2017-05-13 MED ORDER — PHENYLEPHRINE 40 MCG/ML (10ML) SYRINGE FOR IV PUSH (FOR BLOOD PRESSURE SUPPORT)
PREFILLED_SYRINGE | INTRAVENOUS | Status: DC | PRN
  Administered 2017-05-13 (×3): 120 ug via INTRAVENOUS

## 2017-05-13 MED ORDER — CEFAZOLIN SODIUM-DEXTROSE 2-4 GM/100ML-% IV SOLN
2.0000 g | INTRAVENOUS | Status: AC
  Administered 2017-05-13: 2 g via INTRAVENOUS
  Filled 2017-05-13: qty 100

## 2017-05-13 MED ORDER — BUPIVACAINE HCL (PF) 0.25 % IJ SOLN
INTRAMUSCULAR | Status: AC
  Filled 2017-05-13: qty 30

## 2017-05-13 MED ORDER — LIDOCAINE 2% (20 MG/ML) 5 ML SYRINGE
INTRAMUSCULAR | Status: AC
  Filled 2017-05-13: qty 5

## 2017-05-13 MED ORDER — CHLORHEXIDINE GLUCONATE CLOTH 2 % EX PADS: 6.0000 | MEDICATED_PAD | Freq: Once | CUTANEOUS | Status: DC

## 2017-05-13 MED ORDER — ONDANSETRON HCL 4 MG/2ML IJ SOLN: 4.0000 mg | Freq: Once | INTRAMUSCULAR | Status: DC | PRN

## 2017-05-13 MED ORDER — PROPOFOL 10 MG/ML IV BOLUS
INTRAVENOUS | Status: AC
  Filled 2017-05-13: qty 20

## 2017-05-13 MED ORDER — BUPIVACAINE-EPINEPHRINE 0.5% -1:200000 IJ SOLN
INTRAMUSCULAR | Status: DC | PRN
  Administered 2017-05-13: 20 mL

## 2017-05-13 MED ORDER — LACTATED RINGERS IV SOLN
INTRAVENOUS | Status: DC | PRN
  Administered 2017-05-13: 07:00:00 via INTRAVENOUS

## 2017-05-13 MED ORDER — HYDROMORPHONE HCL 1 MG/ML IJ SOLN
0.2500 mg | INTRAMUSCULAR | Status: DC | PRN
  Administered 2017-05-13 (×2): 0.5 mg via INTRAVENOUS

## 2017-05-13 MED ORDER — FENTANYL CITRATE (PF) 100 MCG/2ML IJ SOLN
INTRAMUSCULAR | Status: DC | PRN
  Administered 2017-05-13: 50 ug via INTRAVENOUS

## 2017-05-13 MED ORDER — ACETAMINOPHEN 500 MG PO TABS
1000.0000 mg | ORAL_TABLET | ORAL | Status: AC
  Administered 2017-05-13: 1000 mg via ORAL
  Filled 2017-05-13: qty 2

## 2017-05-13 MED ORDER — PHENYLEPHRINE 40 MCG/ML (10ML) SYRINGE FOR IV PUSH (FOR BLOOD PRESSURE SUPPORT)
PREFILLED_SYRINGE | INTRAVENOUS | Status: AC
  Filled 2017-05-13: qty 10

## 2017-05-13 MED ORDER — EPHEDRINE 5 MG/ML INJ
INTRAVENOUS | Status: AC
  Filled 2017-05-13: qty 10

## 2017-05-13 MED ORDER — EPHEDRINE SULFATE-NACL 50-0.9 MG/10ML-% IV SOSY
PREFILLED_SYRINGE | INTRAVENOUS | Status: DC | PRN
  Administered 2017-05-13 (×2): 10 mg via INTRAVENOUS
  Administered 2017-05-13: 5 mg via INTRAVENOUS

## 2017-05-13 MED ORDER — BUPIVACAINE-EPINEPHRINE (PF) 0.5% -1:200000 IJ SOLN
INTRAMUSCULAR | Status: AC
  Filled 2017-05-13: qty 30

## 2017-05-13 MED ORDER — LIDOCAINE 2% (20 MG/ML) 5 ML SYRINGE
INTRAMUSCULAR | Status: DC | PRN
  Administered 2017-05-13: 100 mg via INTRAVENOUS

## 2017-05-13 MED ORDER — ONDANSETRON HCL 4 MG/2ML IJ SOLN
INTRAMUSCULAR | Status: DC | PRN
  Administered 2017-05-13: 4 mg via INTRAVENOUS

## 2017-05-13 MED ORDER — MEPERIDINE HCL 50 MG/ML IJ SOLN: 6.2500 mg | INTRAMUSCULAR | Status: DC | PRN

## 2017-05-13 MED ORDER — DEXAMETHASONE SODIUM PHOSPHATE 10 MG/ML IJ SOLN
INTRAMUSCULAR | Status: AC
  Filled 2017-05-13: qty 1

## 2017-05-13 MED ORDER — HYDROCODONE-ACETAMINOPHEN 5-325 MG PO TABS: 1.0000 | ORAL_TABLET | Freq: Four times a day (QID) | ORAL | 0 refills | Status: DC | PRN

## 2017-05-13 MED ORDER — FENTANYL CITRATE (PF) 100 MCG/2ML IJ SOLN
INTRAMUSCULAR | Status: AC
  Filled 2017-05-13: qty 2

## 2017-05-13 MED ORDER — ONDANSETRON HCL 4 MG/2ML IJ SOLN
INTRAMUSCULAR | Status: AC
  Filled 2017-05-13: qty 2

## 2017-05-13 MED ORDER — PROPOFOL 10 MG/ML IV BOLUS
INTRAVENOUS | Status: DC | PRN
  Administered 2017-05-13: 150 mg via INTRAVENOUS

## 2017-05-13 MED ORDER — CELECOXIB 200 MG PO CAPS
200.0000 mg | ORAL_CAPSULE | ORAL | Status: AC
  Administered 2017-05-13: 200 mg via ORAL
  Filled 2017-05-13: qty 1

## 2017-05-13 MED ORDER — GABAPENTIN 300 MG PO CAPS
300.0000 mg | ORAL_CAPSULE | ORAL | Status: AC
  Administered 2017-05-13: 300 mg via ORAL
  Filled 2017-05-13: qty 1

## 2017-05-13 MED FILL — HYDROCODON-APAP 5-325: 5-325 | 2 days supply | Qty: 15 | Fill #0

## 2017-05-13 SURGICAL SUPPLY — 20 items
CHLORAPREP W/TINT 26ML (MISCELLANEOUS) ×3 IMPLANT
CLIP TI WIDE RED SMALL 6 (CLIP) ×3 IMPLANT
COVER SURGICAL LIGHT HANDLE (MISCELLANEOUS) ×3 IMPLANT
DERMABOND ADVANCED (GAUZE/BANDAGES/DRESSINGS) ×2
DERMABOND ADVANCED .7 DNX12 (GAUZE/BANDAGES/DRESSINGS) ×1 IMPLANT
DRAIN CHANNEL 19F RND (DRAIN) IMPLANT
DRAPE LAPAROSCOPIC ABDOMINAL (DRAPES) ×3 IMPLANT
ELECT COATED BLADE 2.86 ST (ELECTRODE) ×3 IMPLANT
EVACUATOR SILICONE 100CC (DRAIN) IMPLANT
GLOVE BIO SURGEON STRL SZ7.5 (GLOVE) ×3 IMPLANT
GOWN STRL REUS W/TWL XL LVL3 (GOWN DISPOSABLE) ×6 IMPLANT
KIT BASIN OR (CUSTOM PROCEDURE TRAY) ×3 IMPLANT
KIT MARKER MARGIN INK (KITS) ×3 IMPLANT
NEEDLE HYPO 21X1.5 SAFETY (NEEDLE) ×3 IMPLANT
PACK GENERAL/GYN (CUSTOM PROCEDURE TRAY) ×3 IMPLANT
SUT ETHILON 2 0 PS N (SUTURE) IMPLANT
SUT MNCRL AB 4-0 PS2 18 (SUTURE) ×3 IMPLANT
SUT VIC AB 2-0 SH 18 (SUTURE) ×3 IMPLANT
TOWEL OR 17X26 10 PK STRL BLUE (TOWEL DISPOSABLE) ×3 IMPLANT
TOWEL OR NON WOVEN STRL DISP B (DISPOSABLE) ×3 IMPLANT

## 2017-05-13 NOTE — Anesthesia Preprocedure Evaluation (Signed)
Anesthesia Evaluation  Patient identified by MRN, date of birth, ID band Patient awake    Reviewed: Allergy & Precautions, NPO status , Patient's Chart, lab work & pertinent test results  Airway Mallampati: I  TM Distance: >3 FB Neck ROM: Full    Dental   Pulmonary    Pulmonary exam normal        Cardiovascular hypertension, Pt. on medications Normal cardiovascular exam     Neuro/Psych    GI/Hepatic   Endo/Other    Renal/GU      Musculoskeletal   Abdominal   Peds  Hematology   Anesthesia Other Findings   Reproductive/Obstetrics                             Anesthesia Physical Anesthesia Plan  ASA: II  Anesthesia Plan: General   Post-op Pain Management:    Induction: Intravenous  PONV Risk Score and Plan: 3 and Ondansetron and Treatment may vary due to age or medical condition  Airway Management Planned: LMA  Additional Equipment:   Intra-op Plan:   Post-operative Plan: Extubation in OR  Informed Consent: I have reviewed the patients History and Physical, chart, labs and discussed the procedure including the risks, benefits and alternatives for the proposed anesthesia with the patient or authorized representative who has indicated his/her understanding and acceptance.       Plan Discussed with: CRNA and Surgeon  Anesthesia Plan Comments:         Anesthesia Quick Evaluation  

## 2017-05-13 NOTE — Telephone Encounter (Signed)
Spoke to patients grandson regarding upcoming January appointments per 1/22 sch message.

## 2017-05-13 NOTE — Interval H&P Note (Signed)
History and Physical Interval Note:  05/13/2017 7:30 AM  Tanya Sparks  has presented today for surgery, with the diagnosis of LEFT BREAST CANCER  The various methods of treatment have been discussed with the patient and family. After consideration of risks, benefits and other options for treatment, the patient has consented to  Procedure(s): LEFT BREAST LUMPECTOMY (Left) as a surgical intervention .  The patient's history has been reviewed, patient examined, no change in status, stable for surgery.  I have reviewed the patient's chart and labs.  Questions were answered to the patient's satisfaction.     TOTH III,PAUL S

## 2017-05-13 NOTE — Transfer of Care (Signed)
Immediate Anesthesia Transfer of Care Note  Patient: Tanya Sparks  Procedure(s) Performed: LEFT BREAST LUMPECTOMY (Left Breast)  Patient Location: PACU  Anesthesia Type:General  Level of Consciousness: awake and alert   Airway & Oxygen Therapy: Patient Spontanous Breathing and Patient connected to face mask oxygen  Post-op Assessment: Report given to RN and Post -op Vital signs reviewed and stable  Post vital signs: Reviewed and stable  Last Vitals:  Vitals:   05/13/17 0549  BP: (!) 179/60  Pulse: 72  Resp: 16  Temp: 37 C  SpO2: 100%    Last Pain:  Vitals:   05/13/17 0549  TempSrc: Oral      Patients Stated Pain Goal: 3 (75/88/32 5498)  Complications: No apparent anesthesia complications

## 2017-05-13 NOTE — H&P (Signed)
Tanya Sparks  Location: [facility] Surgery Patient #: [id number] DOB: 25-Jan-1930 Single / Language: Hindi / Race: Refused to Report/Unreported Female   History of Present Illness  The patient is a 84 year old female who presents with breast cancer. We are asked to see the patient in consultation by Dr. Hassan Rowan to evaluate her for a new left breast cancer. The patient is an 84 year old female from [place] who presents with a palpable mass in the upper inner left breast. She states that it has been there for about 5 months. She denies any pain or discharge from the nipple. She has no personal or family history of breast cancer. The mass measured 2.2 cm by ultrasound. The mass was biopsied and came back as an invasive breast cancer that was ER and PR positive and HER-2 negative with a Ki-67 of 10%. The lymph nodes looked normal.   Past Surgical History Breast Biopsy  Left.  Diagnostic Studies History Colonoscopy  never Mammogram  within last year Pap Smear  never  Allergies  No Known Drug Allergies Allergies Reconciled   Medication History  Alendronate Sodium (70MG Tablet, Oral) Active. Calcium (Oral) Specific strength unknown - Active. Aspirin (81MG Tablet, Oral) Active. Losartan Potassium (50MG Tablet, Oral) Active. Multiple Vitamins (Oral) Active. Medications Reconciled  Social History  No alcohol use  No caffeine use  No drug use  Tobacco use  Never smoker.  Family History  First Degree Relatives  No pertinent family history   Pregnancy / Birth History Age at menarche  75 years. Age of menopause  51-50 Gravida  4 Length (months) of breastfeeding  12-24 Maternal age  3-20 Para  56  Other Problems Breast Cancer  High blood pressure  Lump In Breast     Review of Systems General Not Present- Appetite Loss, Chills, Fatigue, Fever, Night Sweats, Weight Gain and Weight Loss. Skin Not Present- Change in Wart/Mole, Dryness,  Hives, Jaundice, New Lesions, Non-Healing Wounds, Rash and Ulcer. HEENT Present- Hearing Loss. Not Present- Earache, Hoarseness, Nose Bleed, Oral Ulcers, Ringing in the Ears, Seasonal Allergies, Sinus Pain, Sore Throat, Visual Disturbances, Wears glasses/contact lenses and Yellow Eyes. Respiratory Not Present- Bloody sputum, Chronic Cough, Difficulty Breathing, Snoring and Wheezing. Breast Not Present- Breast Mass, Breast Pain, Nipple Discharge and Skin Changes. Cardiovascular Not Present- Chest Pain, Difficulty Breathing Lying Down, Leg Cramps, Palpitations, Rapid Heart Rate, Shortness of Breath and Swelling of Extremities. Gastrointestinal Not Present- Abdominal Pain, Bloating, Bloody Stool, Change in Bowel Habits, Chronic diarrhea, Constipation, Difficulty Swallowing, Excessive gas, Gets full quickly at meals, Hemorrhoids, Indigestion, Nausea, Rectal Pain and Vomiting. Female Genitourinary Not Present- Frequency, Nocturia, Painful Urination, Pelvic Pain and Urgency. Musculoskeletal Not Present- Back Pain, Joint Pain, Joint Stiffness, Muscle Pain, Muscle Weakness and Swelling of Extremities. Neurological Not Present- Decreased Memory, Fainting, Headaches, Numbness, Seizures, Tingling, Tremor, Trouble walking and Weakness. Psychiatric Not Present- Anxiety, Bipolar, Change in Sleep Pattern, Depression, Fearful and Frequent crying. Endocrine Not Present- Cold Intolerance, Excessive Hunger, Hair Changes, Heat Intolerance, Hot flashes and New Diabetes. Hematology Not Present- Blood Thinners, Easy Bruising, Excessive bleeding, Gland problems, HIV and Persistent Infections.  Vitals  Weight: 103.4 lb Height: 59in Body Surface Area: 1.39 m Body Mass Index: 20.88 kg/m  Temp.: 98.23F  Pulse: 93 (Regular)  BP: 148/78 (Sitting, Left Arm, Standard)       Physical Exam  General Mental Status-Alert. General Appearance-Consistent with stated age. Hydration-Well  hydrated. Voice-Normal.  Head and Neck Head-normocephalic, atraumatic with no lesions or palpable  masses. Trachea-midline. Thyroid Gland Characteristics - normal size and consistency.  Eye Eyeball - Bilateral-Extraocular movements intact. Sclera/Conjunctiva - Bilateral-No scleral icterus.  Chest and Lung Exam Chest and lung exam reveals -quiet, even and easy respiratory effort with no use of accessory muscles and on auscultation, normal breath sounds, no adventitious sounds and normal vocal resonance. Inspection Chest Wall - Normal. Back - normal.  Breast Note: There is a 2 cm palpable mass in the upper inner left breast. It does not appear to be tethered to the chest wall. There are no overlying skin changes. The mass is mobile. There is a small mobile palpable lymph node in the left axilla. There is no palpable mass in the right breast. There is no other palpable axillary, supraclavicular, or cervical lymphadenopathy.   Cardiovascular Cardiovascular examination reveals -normal heart sounds, regular rate and rhythm with no murmurs and normal pedal pulses bilaterally.  Abdomen Inspection Inspection of the abdomen reveals - No Hernias. Skin - Scar - no surgical scars. Palpation/Percussion Palpation and Percussion of the abdomen reveal - Soft, Non Tender, No Rebound tenderness, No Rigidity (guarding) and No hepatosplenomegaly. Auscultation Auscultation of the abdomen reveals - Bowel sounds normal.  Neurologic Neurologic evaluation reveals -alert and oriented x 3 with no impairment of recent or remote memory. Mental Status-Normal.  Musculoskeletal Normal Exam - Left-Upper Extremity Strength Normal and Lower Extremity Strength Normal. Normal Exam - Right-Upper Extremity Strength Normal and Lower Extremity Strength Normal.  Lymphatic Head & Neck  General Head & Neck Lymphatics: Bilateral - Description - Normal. Axillary  General Axillary Region:  Bilateral - Description - Normal. Tenderness - Non Tender. Femoral & Inguinal  Generalized Femoral & Inguinal Lymphatics: Bilateral - Description - Normal. Tenderness - Non Tender.    Assessment & Plan  MALIGNANT NEOPLASM OF UPPER-INNER QUADRANT OF LEFT BREAST IN FEMALE, ESTROGEN RECEPTOR POSITIVE (C50.212) Impression: The patient appears to have a 2.2 cm cancer in the upper inner left breast with favorable tumor markers. I have talked her about the options for treatment. At this point she would be agreeable to a lumpectomy. I will go and refer her to medical and radiation oncology to talk about adjuvant therapy. I have discussed with her in detail the risks and benefits of the operation as well as some of the technical aspects and she understands and wishes to proceed Current Plans Referred to Oncology, for evaluation and follow up (Oncology). Routine.

## 2017-05-13 NOTE — Op Note (Signed)
05/13/2017  8:46 AM  PATIENT:  Tanya Sparks  84 y.o. female  PRE-OPERATIVE DIAGNOSIS:  LEFT BREAST CANCER  POST-OPERATIVE DIAGNOSIS:  LEFT BREAST CANCER  PROCEDURE:  Procedure(s): LEFT BREAST LUMPECTOMY (Left)  SURGEON:  Surgeon(s) and Role:    * Jovita Kussmaul, MD - Primary  PHYSICIAN ASSISTANT:   ASSISTANTS: none   ANESTHESIA:   local and general  EBL:  10 mL   BLOOD ADMINISTERED:none  DRAINS: none   LOCAL MEDICATIONS USED:  MARCAINE     SPECIMEN:  Source of Specimen:  left breast tissue  DISPOSITION OF SPECIMEN:  PATHOLOGY  COUNTS:  YES  TOURNIQUET:  * No tourniquets in log *  DICTATION: .Dragon Dictation   After informed consent was obtained the patient was brought to the operating room and placed in the supine position on the operating table.  After adequate induction of general anesthesia the patient's left breast was prepped with ChloraPrep, allowed to dry, and draped in usual sterile manner.  An appropriate timeout was performed.  The patient had a palpable cancer in the upper inner quadrant of the left breast.  An elliptical incision was made in the skin overlying the palpable mass with a 15 blade knife.  The incision was carried through the skin and subcutaneous tissue sharply with the electrocautery.  While palpating the cancer I was able to dissected around the cancer using the electrocautery.  This dissection was carried all the way to the chest wall.  Once the specimen was removed it was oriented with the appropriate paint colors.  The specimen was then sent to pathology for further evaluation.  There was no other palpable abnormality at the lumpectomy cavity.  Hemostasis was achieved using the Bovie electrocautery.  The wound was irrigated with saline and infiltrated with more quarter percent Marcaine.  The cavity was marked with clips.  The deep layer of the wound was then closed with layers of interrupted 2-0 Vicryl stitches.  The skin was then closed with  a running 4-0 Monocryl subcuticular stitch.  Dermabond dressings were applied.  The patient tolerated the procedure well.  At the end of the case all needle sponge and instrument counts were correct.  The patient was then awakened and taken to recovery in stable condition.  PLAN OF CARE: Discharge to home after PACU  PATIENT DISPOSITION:  PACU - hemodynamically stable.   Delay start of Pharmacological VTE agent (>24hrs) due to surgical blood loss or risk of bleeding: not applicable

## 2017-05-13 NOTE — Anesthesia Procedure Notes (Signed)
Procedure Name: LMA Insertion Date/Time: 05/13/2017 7:47 AM Performed by: Sharlette Dense, CRNA Patient Re-evaluated:Patient Re-evaluated prior to induction Oxygen Delivery Method: Circle system utilized Preoxygenation: Pre-oxygenation with 100% oxygen Induction Type: IV induction Ventilation: Mask ventilation without difficulty LMA: LMA inserted LMA Size: 3.0 Number of attempts: 1 Placement Confirmation: positive ETCO2 and breath sounds checked- equal and bilateral Tube secured with: Tape Dental Injury: Teeth and Oropharynx as per pre-operative assessment

## 2017-05-13 NOTE — Anesthesia Postprocedure Evaluation (Signed)
Anesthesia Post Note  Patient: Tanya Sparks  Procedure(s) Performed: LEFT BREAST LUMPECTOMY (Left Breast)     Patient location during evaluation: PACU Anesthesia Type: General Level of consciousness: awake and alert Pain management: pain level controlled Vital Signs Assessment: post-procedure vital signs reviewed and stable Respiratory status: spontaneous breathing, nonlabored ventilation, respiratory function stable and patient connected to nasal cannula oxygen Cardiovascular status: blood pressure returned to baseline and stable Postop Assessment: no apparent nausea or vomiting Anesthetic complications: no    Last Vitals:  Vitals:   05/13/17 1015 05/13/17 1035  BP: (!) 163/65 127/60  Pulse: 77 70  Resp: 16 16  Temp: (!) 36.3 C   SpO2: 100% 95%    Last Pain:  Vitals:   05/13/17 1035  TempSrc:   PainSc: 0-No pain                 Sherlie Boyum DAVID

## 2017-05-18 ENCOUNTER — Encounter: Payer: Self-pay | Admitting: Radiation Oncology

## 2017-05-19 MED FILL — AMLODIPINE BESYLATE 5 MG TA: 5 | 30 days supply | Qty: 30 | Fill #1

## 2017-05-19 MED FILL — ALENDRONATE NA 70 MG TAB: 70 | 28 days supply | Qty: 4 | Fill #2

## 2017-05-19 NOTE — Assessment & Plan Note (Signed)
05/13/17:Left Lumpectomy: IDC Grade 2, 3.3 cm with DCIS, PNI and PVI present T2N0 ER 100%, PR 30%, Ki-67 10%, HER-2 negative ratio 1.54  Pathology counseling: I discussed the final pathology report of the patient provided  a copy of this report. I discussed the margins as well as lymph node surgeries. We also discussed the final staging along with previously performed ER/PR and HER-2/neu testing.  Plan:  1. Adj XRT 2. Foll by Adj Anti-estrogen therapy

## 2017-05-20 ENCOUNTER — Inpatient Hospital Stay (HOSPITAL_BASED_OUTPATIENT_CLINIC_OR_DEPARTMENT_OTHER): Payer: Medicaid Other | Admitting: Hematology and Oncology

## 2017-05-20 DIAGNOSIS — Z79899 Other long term (current) drug therapy: Secondary | ICD-10-CM | POA: Diagnosis not present

## 2017-05-20 DIAGNOSIS — I10 Essential (primary) hypertension: Secondary | ICD-10-CM | POA: Diagnosis not present

## 2017-05-20 DIAGNOSIS — Z923 Personal history of irradiation: Secondary | ICD-10-CM

## 2017-05-20 DIAGNOSIS — Z7982 Long term (current) use of aspirin: Secondary | ICD-10-CM | POA: Diagnosis not present

## 2017-05-20 DIAGNOSIS — C50212 Malignant neoplasm of upper-inner quadrant of left female breast: Secondary | ICD-10-CM | POA: Diagnosis not present

## 2017-05-20 DIAGNOSIS — Z170 Estrogen receptor positive status [ER+]: Secondary | ICD-10-CM

## 2017-05-20 DIAGNOSIS — M129 Arthropathy, unspecified: Secondary | ICD-10-CM

## 2017-05-20 DIAGNOSIS — M810 Age-related osteoporosis without current pathological fracture: Secondary | ICD-10-CM

## 2017-05-20 NOTE — Progress Notes (Signed)
Patient Care Team: [name], NP as PCP - General (Internal Medicine)  DIAGNOSIS:  Encounter Diagnosis  Name Primary?  . Malignant neoplasm of upper-inner quadrant of left breast in female, estrogen receptor positive ([place])     SUMMARY OF ONCOLOGIC HISTORY:   Malignant neoplasm of upper-inner quadrant of left breast in female, estrogen receptor positive ([place])   04/06/2017 Initial Diagnosis    Left breast UIQ spiculated mass 2.2 x 1.4 x 2 cm, 5 cm from the nipple, no axillary nodes, biopsy revealed IDC with DCIS grade 1-2, ER 100%, PR 30%, Ki-67 10%, HER-2 negative ratio 1.54, T2 N0 stage Ib clinical stage      05/13/2017 Surgery    IDC Grade 2, 3.3 cm with DCIS, PNI and PVI present T2N0 ER 100%, PR 30%, Ki-67 10%, HER-2 negative ratio 1.54       CHIEF COMPLIANT: Follow-up after recent surgery for left breast cancer  INTERVAL HISTORY: Tanya Sparks is a 84 year old with above-mentioned history of left breast cancer who underwent lumpectomy and is here today to discuss the final pathology report.  She is recovering very well from the surgery.  She does not have any pain or discomfort.  REVIEW OF SYSTEMS:   Constitutional: Denies fevers, chills or abnormal weight loss Eyes: Denies blurriness of vision Ears, nose, mouth, throat, and face: Denies mucositis or sore throat Respiratory: Denies cough, dyspnea or wheezes Cardiovascular: Denies palpitation, chest discomfort Gastrointestinal:  Denies nausea, heartburn or change in bowel habits Skin: Denies abnormal skin rashes Lymphatics: Denies new lymphadenopathy or easy bruising Neurological:Denies numbness, tingling or new weaknesses Behavioral/Psych: Mood is stable, no new changes  Extremities: No lower extremity edema Breast: Recent surgery for left breast cancer with lumpectomy All other systems were reviewed with the patient and are negative.  I have reviewed the past medical history, past surgical history, social  history and family history with the patient and they are unchanged from previous note.  ALLERGIES:  has No Known Allergies.  MEDICATIONS:  Current Outpatient Medications  Medication Sig Dispense Refill  . alendronate (FOSAMAX) 70 MG tablet Take 1 tablet (70 mg total) by mouth every 7 (seven) days. Take with a full glass of water on an empty stomach. (Patient taking differently: Take 70 mg by mouth every Saturday. Take with a full glass of water on an empty stomach.) 4 tablet 11  . amLODipine (NORVASC) 5 MG tablet Take 1 tablet (5 mg total) by mouth daily. 30 tablet 3  . aspirin (ECOTRIN LOW STRENGTH) 81 MG EC tablet Take 81 mg by mouth daily. Swallow whole.    . Calcium Carbonate-Vitamin D (CALTRATE 600+D) 600-400 MG-UNIT tablet Take 1 tablet by mouth 2 (two) times daily.    Marland Kitchen HYDROcodone-acetaminophen (NORCO/VICODIN) 5-325 MG tablet Take 1-2 tablets by mouth every 6 (six) hours as needed for moderate pain or severe pain. 15 tablet 0  . losartan-hydrochlorothiazide (HYZAAR) 50-12.5 MG tablet Take 1 tablet by mouth daily.    . Multiple Vitamin (MULTIVITAMIN WITH MINERALS) TABS tablet Take 1 tablet by mouth daily.    Marland Kitchen PRESCRIPTION MEDICATION Place 1 drop into both eyes at bedtime. DuoTrav : Travatan and Timolol     No current facility-administered medications for this visit.     PHYSICAL EXAMINATION: ECOG PERFORMANCE STATUS: 1 - Symptomatic but completely ambulatory  Vitals:   05/20/17 0834  BP: (!) 169/50  Pulse: 72  Resp: 18  Temp: 97.7 F (36.5 C)  SpO2: 98%   Filed Weights  05/20/17 0834  Weight: 102 lb 8 oz (46.5 kg)    GENERAL:alert, no distress and comfortable SKIN: skin color, texture, turgor are normal, no rashes or significant lesions EYES: normal, Conjunctiva are pink and non-injected, sclera clear OROPHARYNX:no exudate, no erythema and lips, buccal mucosa, and tongue normal  NECK: supple, thyroid normal size, non-tender, without nodularity LYMPH:  no palpable  lymphadenopathy in the cervical, axillary or inguinal LUNGS: clear to auscultation and percussion with normal breathing effort HEART: regular rate & rhythm and no murmurs and no lower extremity edema ABDOMEN:abdomen soft, non-tender and normal bowel sounds MUSCULOSKELETAL:no cyanosis of digits and no clubbing  NEURO: alert & oriented x 3 with fluent speech, no focal motor/sensory deficits EXTREMITIES: No lower extremity edema  LABORATORY DATA:  I have reviewed the data as listed CMP Latest Ref Rng & Units 05/10/2017 03/15/2017 12/08/2016  Glucose 65 - 99 mg/dL 90 96 92  BUN 6 - 20 mg/dL 12 10 15   Creatinine 0.44 - 1.00 mg/dL 0.65 0.65 0.71  Sodium 135 - 145 mmol/L 134(L) 136 131(L)  Potassium 3.5 - 5.1 mmol/L 4.0 4.6 4.3  Chloride 101 - 111 mmol/L 100(L) 100 98  CO2 22 - 32 mmol/L 28 29 30   Calcium 8.9 - 10.3 mg/dL 9.5 9.9 9.5  Total Protein 6.0 - 8.3 g/dL - 7.5 7.8  Total Bilirubin 0.2 - 1.2 mg/dL - 0.3 0.5  Alkaline Phos 39 - 117 U/L - 57 53  AST 0 - 37 U/L - 19 19  ALT 0 - 35 U/L - 11 11    Lab Results  Component Value Date   WBC 5.8 05/10/2017   HGB 12.5 05/10/2017   HCT 36.6 05/10/2017   MCV 88.4 05/10/2017   PLT 285 05/10/2017    ASSESSMENT & PLAN:  Malignant neoplasm of upper-inner quadrant of left breast in female, estrogen receptor positive ([facility]) 05/13/17:Left Lumpectomy: IDC Grade 2, 3.3 cm with DCIS, PNI and PVI present T2N0 ER 100%, PR 30%, Ki-67 10%, HER-2 negative ratio 1.54  Pathology counseling: I discussed the final pathology report of the patient provided  a copy of this report. I discussed the margins as well as lymph node surgeries. We also discussed the final staging along with previously performed ER/PR and HER-2/neu testing.  Plan:  1. Adj XRT 2. Foll by Adj Anti-estrogen therapy I will see the patient at the end of radiation to begin antiestrogen therapy.  I spent 25 minutes talking to the patient of which more than half was spent in counseling and  coordination of care.  No orders of the defined types were placed in this encounter.  The patient has a good understanding of the overall plan. she agrees with it. she will call with any problems that may develop before the next visit here.   Harriette Ohara, MD 05/20/17

## 2017-06-03 NOTE — Progress Notes (Signed)
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  Receptor Status: ER(100%), PR (30%), Her2-neu (NEG), Ki-(10%)  05/13/17 Diagnosis Breast, lumpectomy, left - INVASIVE DUCTAL CARCINOMA, NOTTINGHAM GRADE 2 OF 3, 3.3 CM - DUCTAL CARCINOMA IN SITU, INTERMEDIATE NUCLEAR GRADE - CALCIFICATIONS ASSOCIATED WITH CARCINOMA - MARGINS UNINVOLVED BY CARCINOMA (0.1 CM INFERIOR MARGIN) - PERINEURAL INVASION AND LYMPHOVASCULAR SPACE INVASION PRESENT - PREVIOUS BIOPSY SITE CHANGES  Did patient present with symptoms or was this found on screening mammography?: She presented to Dr. Marlou Starks on 04/21/17 with a palpable upper inner Left Breast mass that has been present for 5 months.   Past/Anticipated interventions by surgeon, if any: Dr. Marlou Starks 05/13/17 PROCEDURE:  Procedure(s): LEFT BREAST LUMPECTOMY (Left) SURGEON:  Surgeon(s) and Role:    Jovita Kussmaul, MD - Primary   Past/Anticipated interventions by medical oncology, if any:  Dr. Lindi Adie 05/20/17 Plan:  1. Adj XRT 2. Foll by Adj Anti-estrogen therapy I will see the patient at the end of radiation to begin antiestrogen therapy.   Lymphedema issues, if any:  She reports minor swelling to her incision site. The incision is tight feeling when she raises her left arm.   Pain issues, if any: She denies current pain  SAFETY ISSUES:  Prior radiation? No  Pacemaker/ICD? No  Possible current pregnancy? No  Is the patient on methotrexate? No  Current Complaints / other details:   Her grandson reports today that she remains hesitant about having radiation. She is scheduled for CT simulation.   BP (!) 145/63   Pulse 78   Temp 97.9 F (36.6 C)   Ht 4' 11"  (1.499 m)   Wt 104 lb (47.2 kg)   SpO2 100% Comment: room air  BMI 21.01 kg/m     Wt Readings from Last 3 Encounters:  06/09/17 104 lb (47.2 kg)  05/20/17 102 lb 8 oz (46.5 kg)  05/13/17 101 lb (45.8 kg)

## 2017-06-07 ENCOUNTER — Encounter: Payer: Self-pay | Admitting: Hematology and Oncology

## 2017-06-09 ENCOUNTER — Ambulatory Visit
Admission: RE | Admit: 2017-06-09 | Discharge: 2017-06-09 | Disposition: A | Discharge status: Home / Self Care | Payer: Medicaid Other | Source: Ambulatory Visit | Attending: Radiation Oncology | Admitting: Radiation Oncology

## 2017-06-09 ENCOUNTER — Encounter: Payer: Self-pay | Admitting: Radiation Oncology

## 2017-06-09 VITALS — BP 145/63 | HR 78 | Temp 97.9°F | Ht 59.0 in | Wt 104.0 lb

## 2017-06-09 DIAGNOSIS — C50212 Malignant neoplasm of upper-inner quadrant of left female breast: Secondary | ICD-10-CM

## 2017-06-09 DIAGNOSIS — Z170 Estrogen receptor positive status [ER+]: Secondary | ICD-10-CM | POA: Diagnosis not present

## 2017-06-09 DIAGNOSIS — Z510 Encounter for antineoplastic radiation therapy: Secondary | ICD-10-CM | POA: Insufficient documentation

## 2017-06-09 NOTE — Progress Notes (Signed)
Radiation Oncology         (336) [phone] ________________________________  Name: Tanya Sparks MRN: [id number]  Date: 06/09/2017  DOB: 06-Jan-1930  Follow-Up Visit Note  Outpatient  CC: [name], NP  Nicholas Lose, MD  Diagnosis:      ICD-10-CM   1. Malignant neoplasm of upper-inner quadrant of left breast in female, estrogen receptor positive ([place]) C50.212    Z17.0     Stage IB clinical stage, left Breast UIQ Invasive Ductal Carcinoma, ER 100% / PR 30% / Her2 negative, Grade 1-2  Cancer Staging Malignant neoplasm of upper-inner quadrant of left breast in female, estrogen receptor positive ([place]) Staging form: Breast, AJCC 8th Edition - Clinical: Stage IB (cT2, cN0, cM0, G2, ER: Positive, PR: Positive, HER2: Negative) - Unsigned  Pathologic stage T2 Nx  CHIEF COMPLAINT: Here to discuss management of left breast cancer  Narrative:  The patient returns today for follow-up.     Since consultation, she underwent left breast lumpectomy on 05/13/17 with Dr. Marlou Starks. This revealed invasive ductal carcinoma, grade 2, and 3.3 cm in size. DCIS, intermediate grade noted. Margins were uninvolved by carcinoma. Perineural invasion and lymphovascular space invasion present. The patient met with Dr. Lindi Adie on 05/20/17. Per his note, he recommends adjuvant radiation therapy followed by adjuvant anti-estrogen therapy.  She reports minor swelling to her incision site. She also reports the incision feels tight when she raises her left arm. She denies any current pain.  Her grandson is translating for her today at their preference.           ALLERGIES:  has No Known Allergies.  Meds: Current Outpatient Medications  Medication Sig Dispense Refill  . alendronate (FOSAMAX) 70 MG tablet Take 1 tablet (70 mg total) by mouth every 7 (seven) days. Take with a full glass of water on an empty stomach. (Patient taking differently: Take 70 mg by mouth every Saturday. Take with a full glass of  water on an empty stomach.) 4 tablet 11  . amLODipine (NORVASC) 5 MG tablet Take 1 tablet (5 mg total) by mouth daily. 30 tablet 3  . aspirin (ECOTRIN LOW STRENGTH) 81 MG EC tablet Take 81 mg by mouth daily. Swallow whole.    . Calcium Carbonate-Vitamin D (CALTRATE 600+D) 600-400 MG-UNIT tablet Take 1 tablet by mouth 2 (two) times daily.    Marland Kitchen losartan-hydrochlorothiazide (HYZAAR) 50-12.5 MG tablet Take 1 tablet by mouth daily.    . Multiple Vitamin (MULTIVITAMIN WITH MINERALS) TABS tablet Take 1 tablet by mouth daily.    Marland Kitchen PRESCRIPTION MEDICATION Place 1 drop into both eyes at bedtime. DuoTrav : Travatan and Timolol    . HYDROcodone-acetaminophen (NORCO/VICODIN) 5-325 MG tablet Take 1-2 tablets by mouth every 6 (six) hours as needed for moderate pain or severe pain. (Patient not taking: Reported on 06/09/2017) 15 tablet 0   No current facility-administered medications for this encounter.     Physical Findings:  height is _0  (1.499 m) and weight is 104 lb (47.2 kg). Her temperature is 97.9 F (36.6 C). Her blood pressure is 145/63 (abnormal) and her pulse is 78. Her oxygen saturation is 100%. .     General: Alert and oriented, in no acute distress Extremities: No cyanosis or edema. Good range of motion in bilateral shoulders. Psychiatric: Judgment and insight are intact. Affect is appropriate. Breast exam reveals surgical scar in upper right breat that has healed well without surgical swelling.  Lab Findings: Lab Results  Component Value Date  WBC 5.8 05/10/2017   HGB 12.5 05/10/2017   HCT 36.6 05/10/2017   MCV 88.4 05/10/2017   PLT 285 05/10/2017    _0 @  Radiographic Findings: No results found.  Impression/Plan:  We discussed adjuvant radiotherapy today.  I recommend radiotherapy to the left breast /axilla to reduce her risk of locoregional recurrence by 2/3.  The risks, benefits and side effects of this treatment were discussed in detail.  She understands that  radiotherapy is associated with skin irritation and fatigue in the acute setting. Late effects can include cosmetic changes and rare injury to internal organs.   She is enthusiastic about proceeding with treatment.  The patient complains of tightness in the upper left chest related to her surgery. I demonstrated two stretching exercises for the patient to participate in twice a day to reduce tightness to the incision site.  Of note, the patient's grandson is concerned he is unavailable to translate for all of the patient's appointment. The patient speaks [place] and is interested in a Electronics engineer. He reports the patient's son will be flying in from [place] to accompany his mom.  Treatment will start in 2 weeks to allow time for the patient's son to fly in.  A total of 3 medically necessary complex treatment devices will be fabricated and supervised by me: 2 fields with MLCs for custom blocks to protect heart, and lungs;  and, a Vac-lok. MORE COMPLEX DEVICES MAY BE MADE IN DOSIMETRY FOR FIELD IN FIELD BEAMS FOR DOSE HOMOGENEITY.  I have requested : 3D Simulation which is medically necessary to give adequate dose to at risk tissues while sparing lungs and heart.  I have requested a DVH of the following structures: lungs, heart, left lumpectomy cavity.  Will treat with high tangents to cover the axilla, which was not surgically addressed.  The patient will receive 40.05 Gy in 15 fractions to the left breast with 2 fields.  This will be followed by a boost.  I spent 20 minutes face to face with the patient and more than 50% of that time was spent in counseling and/or coordination of care. _____________________________________   Eppie Gibson, MD  This document serves as a record of services personally performed by Eppie Gibson, MD. It was created on her behalf by Bethann Humble, a trained medical scribe. The creation of this record is based on the scribe's personal observations and the provider's  statements to them. This document has been checked and approved by the attending provider.

## 2017-06-09 NOTE — Progress Notes (Signed)
  Radiation Oncology         (336) [phone] ________________________________  Name: Tanya Sparks MRN: [id number]  Date: 06/09/2017  DOB: [birth year]  SIMULATION AND TREATMENT PLANNING NOTE    Outpatient  DIAGNOSIS:     ICD-10-CM   1. Malignant neoplasm of upper-inner quadrant of left breast in female, estrogen receptor positive ([place]) C50.212    Z17.0     NARRATIVE:  The patient was brought to the [facility].  Identity was confirmed.  All relevant records and images related to the planned course of therapy were reviewed.  The patient freely provided informed written consent to proceed with treatment after reviewing the details related to the planned course of therapy. The consent form was witnessed and verified by the simulation staff.    Then, the patient was set-up in a stable reproducible supine position for radiation therapy with her ipsilateral arm over her head, and her upper body secured in a custom-made Vac-lok device.  CT images were obtained.  Surface markings were placed.  The CT images were loaded into the planning software.    TREATMENT PLANNING NOTE: Treatment planning then occurred.  The radiation prescription was entered and confirmed.     A total of 3 medically necessary complex treatment devices were fabricated and supervised by me: 2 fields with MLCs for custom blocks to protect heart, and lungs;  and, a Vac-lok. MORE COMPLEX DEVICES MAY BE MADE IN DOSIMETRY FOR FIELD IN FIELD BEAMS FOR DOSE HOMOGENEITY.  I have requested : 3D Simulation which is medically necessary to give adequate dose to at risk tissues while sparing lungs and heart.  I have requested a DVH of the following structures: lungs, heart, lumpectomy cavity.    The patient will receive 40.05 Gy in 15 fractions to the left breast with 2 tangential fields.   This will be followed by a boost.  Optical Surface Tracking Plan:  Since intensity modulated radiotherapy (IMRT) and 3D conformal  radiation treatment methods are predicated on accurate and precise positioning for treatment, intrafraction motion monitoring is medically necessary to ensure accurate and safe treatment delivery. The ability to quantify intrafraction motion without excessive ionizing radiation dose can only be performed with optical surface tracking. Accordingly, surface imaging offers the opportunity to obtain 3D measurements of patient position throughout IMRT and 3D treatments without excessive radiation exposure. I am ordering optical surface tracking for this patient's upcoming course of radiotherapy.  ________________________________   Reference:  Ursula Alert, J, et al. Surface imaging-based analysis of intrafraction motion for breast radiotherapy patients.Journal of [place], n. 6, nov. 2014. ISSN [id number].  Available at: <http://www.jacmp.org/index.php/jacmp/article/view/4957>.    -----------------------------------  Eppie Gibson, MD

## 2017-06-10 ENCOUNTER — Encounter: Payer: Self-pay | Admitting: Radiation Oncology

## 2017-06-11 ENCOUNTER — Telehealth: Payer: Self-pay | Admitting: Hematology and Oncology

## 2017-06-11 NOTE — Telephone Encounter (Signed)
Mailed patient calendar of upcoming April appointments.  °

## 2017-06-14 DIAGNOSIS — Z510 Encounter for antineoplastic radiation therapy: Secondary | ICD-10-CM | POA: Diagnosis not present

## 2017-06-16 ENCOUNTER — Other Ambulatory Visit: Payer: Self-pay | Admitting: Family

## 2017-06-18 ENCOUNTER — Encounter: Payer: Self-pay | Admitting: Radiation Oncology

## 2017-06-18 NOTE — Progress Notes (Signed)
Financial Counseling--Spoke with son today regarding financial concerns--he is going to bring letter of support on Thur 3/7 at 1:00 to apply for [place]

## 2017-06-21 MED FILL — AMLODIPINE BESYLATE 5 MG TA: 5 | 30 days supply | Qty: 30 | Fill #2

## 2017-06-21 MED FILL — ALENDRONATE NA 70 MG TAB: 70 | 28 days supply | Qty: 4 | Fill #3

## 2017-06-24 ENCOUNTER — Ambulatory Visit
Admission: RE | Admit: 2017-06-24 | Discharge: 2017-06-24 | Disposition: A | Discharge status: Home / Self Care | Payer: Medicaid Other | Source: Ambulatory Visit | Attending: Radiation Oncology | Admitting: Radiation Oncology

## 2017-06-24 DIAGNOSIS — C50212 Malignant neoplasm of upper-inner quadrant of left female breast: Secondary | ICD-10-CM | POA: Diagnosis present

## 2017-06-24 DIAGNOSIS — Z170 Estrogen receptor positive status [ER+]: Secondary | ICD-10-CM | POA: Insufficient documentation

## 2017-06-24 DIAGNOSIS — Z510 Encounter for antineoplastic radiation therapy: Secondary | ICD-10-CM | POA: Insufficient documentation

## 2017-06-28 ENCOUNTER — Ambulatory Visit
Admission: RE | Admit: 2017-06-28 | Discharge: 2017-06-28 | Disposition: A | Discharge status: Home / Self Care | Payer: Medicaid Other | Source: Ambulatory Visit | Attending: Radiation Oncology | Admitting: Radiation Oncology

## 2017-06-28 DIAGNOSIS — Z170 Estrogen receptor positive status [ER+]: Principal | ICD-10-CM

## 2017-06-28 DIAGNOSIS — Z510 Encounter for antineoplastic radiation therapy: Secondary | ICD-10-CM | POA: Diagnosis not present

## 2017-06-28 DIAGNOSIS — C50212 Malignant neoplasm of upper-inner quadrant of left female breast: Secondary | ICD-10-CM

## 2017-06-28 MED ORDER — RADIAPLEXRX EX GEL
Freq: Once | CUTANEOUS | Status: AC
  Administered 2017-06-28: 17:00:00 via TOPICAL

## 2017-06-28 MED ORDER — ALRA NON-METALLIC DEODORANT (RAD-ONC)
1.0000 "application " | Freq: Once | TOPICAL | Status: AC
  Administered 2017-06-28: 1 via TOPICAL

## 2017-06-28 NOTE — Progress Notes (Signed)
Pt here for patient teaching.  Pt given Radiation and You booklet, skin care instructions, Alra deodorant and Radiaplex gel.  Reviewed areas of pertinence such as fatigue, skin changes, breast tenderness and breast swelling . Pt able to give teach back of to pat skin and drink plenty of water,apply Radiaplex bid, avoid applying anything to skin within 4 hours of treatment, avoid wearing an under wire bra and to use an electric razor if they must shave. Pt verbalizes understanding of information given and will contact nursing with any questions or concerns.   Education provided via an interpreter.    Http://rtanswers.org/treatmentinformation/whattoexpect/index

## 2017-06-29 ENCOUNTER — Ambulatory Visit
Admission: RE | Admit: 2017-06-29 | Discharge: 2017-06-29 | Disposition: A | Discharge status: Home / Self Care | Payer: Medicaid Other | Source: Ambulatory Visit | Attending: Radiation Oncology | Admitting: Radiation Oncology

## 2017-06-29 DIAGNOSIS — Z510 Encounter for antineoplastic radiation therapy: Secondary | ICD-10-CM | POA: Diagnosis not present

## 2017-06-30 ENCOUNTER — Ambulatory Visit
Admission: RE | Admit: 2017-06-30 | Discharge: 2017-06-30 | Disposition: A | Discharge status: Home / Self Care | Payer: Medicaid Other | Source: Ambulatory Visit | Attending: Radiation Oncology | Admitting: Radiation Oncology

## 2017-06-30 DIAGNOSIS — Z510 Encounter for antineoplastic radiation therapy: Secondary | ICD-10-CM | POA: Diagnosis not present

## 2017-07-01 ENCOUNTER — Ambulatory Visit
Admission: RE | Admit: 2017-07-01 | Discharge: 2017-07-01 | Disposition: A | Discharge status: Home / Self Care | Payer: Medicaid Other | Source: Ambulatory Visit | Attending: Radiation Oncology | Admitting: Radiation Oncology

## 2017-07-01 DIAGNOSIS — Z510 Encounter for antineoplastic radiation therapy: Secondary | ICD-10-CM | POA: Diagnosis not present

## 2017-07-02 ENCOUNTER — Ambulatory Visit
Admission: RE | Admit: 2017-07-02 | Discharge: 2017-07-02 | Disposition: A | Discharge status: Home / Self Care | Payer: Medicaid Other | Source: Ambulatory Visit | Attending: Radiation Oncology | Admitting: Radiation Oncology

## 2017-07-02 DIAGNOSIS — Z510 Encounter for antineoplastic radiation therapy: Secondary | ICD-10-CM | POA: Diagnosis not present

## 2017-07-05 ENCOUNTER — Ambulatory Visit
Admission: RE | Admit: 2017-07-05 | Discharge: 2017-07-05 | Disposition: A | Discharge status: Home / Self Care | Payer: Medicaid Other | Source: Ambulatory Visit | Attending: Radiation Oncology | Admitting: Radiation Oncology

## 2017-07-05 DIAGNOSIS — Z510 Encounter for antineoplastic radiation therapy: Secondary | ICD-10-CM | POA: Diagnosis not present

## 2017-07-06 ENCOUNTER — Ambulatory Visit
Admission: RE | Admit: 2017-07-06 | Discharge: 2017-07-06 | Disposition: A | Discharge status: Home / Self Care | Payer: Medicaid Other | Source: Ambulatory Visit | Attending: Radiation Oncology | Admitting: Radiation Oncology

## 2017-07-06 DIAGNOSIS — Z510 Encounter for antineoplastic radiation therapy: Secondary | ICD-10-CM | POA: Diagnosis not present

## 2017-07-07 ENCOUNTER — Ambulatory Visit
Admission: RE | Admit: 2017-07-07 | Discharge: 2017-07-07 | Disposition: A | Discharge status: Home / Self Care | Payer: Medicaid Other | Source: Ambulatory Visit | Attending: Radiation Oncology | Admitting: Radiation Oncology

## 2017-07-07 DIAGNOSIS — Z510 Encounter for antineoplastic radiation therapy: Secondary | ICD-10-CM | POA: Diagnosis not present

## 2017-07-08 ENCOUNTER — Ambulatory Visit
Admission: RE | Admit: 2017-07-08 | Discharge: 2017-07-08 | Disposition: A | Discharge status: Home / Self Care | Payer: Medicaid Other | Source: Ambulatory Visit | Attending: Radiation Oncology | Admitting: Radiation Oncology

## 2017-07-08 DIAGNOSIS — Z510 Encounter for antineoplastic radiation therapy: Secondary | ICD-10-CM | POA: Diagnosis not present

## 2017-07-09 ENCOUNTER — Ambulatory Visit
Admission: RE | Admit: 2017-07-09 | Discharge: 2017-07-09 | Disposition: A | Discharge status: Home / Self Care | Payer: Medicaid Other | Source: Ambulatory Visit | Attending: Radiation Oncology | Admitting: Radiation Oncology

## 2017-07-09 DIAGNOSIS — Z510 Encounter for antineoplastic radiation therapy: Secondary | ICD-10-CM | POA: Diagnosis not present

## 2017-07-12 ENCOUNTER — Ambulatory Visit
Admission: RE | Admit: 2017-07-12 | Discharge: 2017-07-12 | Disposition: A | Discharge status: Home / Self Care | Payer: Medicaid Other | Source: Ambulatory Visit | Attending: Radiation Oncology | Admitting: Radiation Oncology

## 2017-07-12 ENCOUNTER — Ambulatory Visit: Payer: Medicaid Other | Admitting: Radiation Oncology

## 2017-07-12 DIAGNOSIS — Z510 Encounter for antineoplastic radiation therapy: Secondary | ICD-10-CM | POA: Diagnosis not present

## 2017-07-13 ENCOUNTER — Ambulatory Visit
Admission: RE | Admit: 2017-07-13 | Discharge: 2017-07-13 | Disposition: A | Discharge status: Home / Self Care | Payer: Medicaid Other | Source: Ambulatory Visit | Attending: Radiation Oncology | Admitting: Radiation Oncology

## 2017-07-13 DIAGNOSIS — Z510 Encounter for antineoplastic radiation therapy: Secondary | ICD-10-CM | POA: Diagnosis not present

## 2017-07-14 ENCOUNTER — Ambulatory Visit
Admission: RE | Admit: 2017-07-14 | Discharge: 2017-07-14 | Disposition: A | Discharge status: Home / Self Care | Payer: Medicaid Other | Source: Ambulatory Visit | Attending: Radiation Oncology | Admitting: Radiation Oncology

## 2017-07-14 DIAGNOSIS — Z510 Encounter for antineoplastic radiation therapy: Secondary | ICD-10-CM | POA: Diagnosis not present

## 2017-07-15 ENCOUNTER — Ambulatory Visit
Admission: RE | Admit: 2017-07-15 | Discharge: 2017-07-15 | Disposition: A | Discharge status: Home / Self Care | Payer: Medicaid Other | Source: Ambulatory Visit | Attending: Radiation Oncology | Admitting: Radiation Oncology

## 2017-07-15 DIAGNOSIS — Z510 Encounter for antineoplastic radiation therapy: Secondary | ICD-10-CM | POA: Diagnosis not present

## 2017-07-16 ENCOUNTER — Ambulatory Visit
Admission: RE | Admit: 2017-07-16 | Discharge: 2017-07-16 | Disposition: A | Discharge status: Home / Self Care | Payer: Medicaid Other | Source: Ambulatory Visit | Attending: Radiation Oncology | Admitting: Radiation Oncology

## 2017-07-16 DIAGNOSIS — Z510 Encounter for antineoplastic radiation therapy: Secondary | ICD-10-CM | POA: Diagnosis not present

## 2017-07-19 ENCOUNTER — Ambulatory Visit: Payer: Medicaid Other | Admitting: Hematology and Oncology

## 2017-07-19 ENCOUNTER — Ambulatory Visit: Payer: Medicaid Other | Admitting: Family

## 2017-07-19 ENCOUNTER — Encounter: Payer: Self-pay | Admitting: Family

## 2017-07-19 ENCOUNTER — Ambulatory Visit
Admission: RE | Admit: 2017-07-19 | Discharge: 2017-07-19 | Disposition: A | Discharge status: Home / Self Care | Payer: Medicaid Other | Source: Ambulatory Visit | Attending: Radiation Oncology | Admitting: Radiation Oncology

## 2017-07-19 VITALS — BP 125/52 | HR 79 | Resp 16 | Ht 59.0 in | Wt 104.4 lb

## 2017-07-19 DIAGNOSIS — M810 Age-related osteoporosis without current pathological fracture: Secondary | ICD-10-CM | POA: Diagnosis not present

## 2017-07-19 DIAGNOSIS — C50212 Malignant neoplasm of upper-inner quadrant of left female breast: Secondary | ICD-10-CM | POA: Diagnosis not present

## 2017-07-19 DIAGNOSIS — C50912 Malignant neoplasm of unspecified site of left female breast: Secondary | ICD-10-CM | POA: Diagnosis not present

## 2017-07-19 DIAGNOSIS — Z170 Estrogen receptor positive status [ER+]: Secondary | ICD-10-CM | POA: Diagnosis not present

## 2017-07-19 DIAGNOSIS — I10 Essential (primary) hypertension: Secondary | ICD-10-CM

## 2017-07-19 DIAGNOSIS — Z510 Encounter for antineoplastic radiation therapy: Secondary | ICD-10-CM | POA: Diagnosis not present

## 2017-07-19 LAB — BASIC METABOLIC PANEL
BUN: 12 mg/dL (ref 6–23)
CHLORIDE: 96 meq/L (ref 96–112)
CO2: 31 mEq/L (ref 19–32)
Calcium: 9.6 mg/dL (ref 8.4–10.5)
Creatinine, Ser: 0.59 mg/dL (ref 0.40–1.20)
GFR: 102.83 mL/min (ref >60.00)
GLUCOSE: 119 mg/dL — AB (ref 70–99)
POTASSIUM: 3.9 meq/L (ref 3.5–5.1)
Sodium: 132 mEq/L — ABNORMAL LOW (ref 135–145)

## 2017-07-19 MED ORDER — LOSARTAN POTASSIUM-HCTZ 50-12.5 MG PO TABS: 1.0000 | ORAL_TABLET | Freq: Every day | ORAL | 1 refills | Status: AC

## 2017-07-19 MED ORDER — AMLODIPINE BESYLATE 5 MG PO TABS: 5.0000 mg | ORAL_TABLET | Freq: Every day | ORAL | 1 refills | Status: AC

## 2017-07-19 MED ORDER — RADIAPLEXRX EX GEL
Freq: Once | CUTANEOUS | Status: AC
  Administered 2017-07-19: 17:00:00 via TOPICAL

## 2017-07-19 MED FILL — ALENDRONATE NA 70 MG TAB: 70 | 28 days supply | Qty: 4 | Fill #4

## 2017-07-19 MED FILL — AMLODIPINE BESYLATE 5 MG TA: 5 | 34 days supply | Qty: 34 | Fill #0

## 2017-07-19 MED FILL — LOSARTAN POTASSIUM-HCTZ 50-: 50-12.5 | 34 days supply | Qty: 34 | Fill #0

## 2017-07-19 NOTE — Progress Notes (Addendum)
Subjective:    Patient ID: Tanya Sparks, female    DOB: 17-Feb-1930, 84 y.o.   MRN: [id number]  HPI   Tanya Sparks is an 84 yr old female who presents today for follow up. She is accompanied by her grandson who helps to interpret today.  HTN- She is maintained on hyzaar and amlodipine.   BP Readings from Last 3 Encounters:  07/19/17 (!) 125/52  06/09/17 (!) 145/63  05/20/17 (!) 169/50   Breast Cancer- diagnosed 04/07/17, treated with lumpectomy 05/13/17, radiation therapy (ongoing), to be followed by letrozole 2.5mg .    Osteoporosis- she is maintained on fosamax once weekly, caltrate.    Wt Readings from Last 3 Encounters:  07/19/17 104 lb 6.4 oz (47.4 kg)  06/09/17 104 lb (47.2 kg)  05/20/17 102 lb 8 oz (46.5 kg)    Review of Systems    see HPI  Past Medical History:  Diagnosis Date  . Arthritis   . Breast cancer ([place])    INVASIVE MAMMARY CARCINOMA WITH EXTRACELLULAR MUCIN.  Marland Kitchen Breast mass   . Cataract   . Clotting disorder ([place])   . Hypertension   . Osteoporosis 03/18/2017     Social History   Socioeconomic History  . Marital status: Widowed    Spouse name: Not on file  . Number of children: Not on file  . Years of education: Not on file  . Highest education level: Not on file  Occupational History  . Not on file  Social Needs  . Financial resource strain: Not on file  . Food insecurity:    Worry: Not on file    Inability: Not on file  . Transportation needs:    Medical: Not on file    Non-medical: Not on file  Tobacco Use  . Smoking status: Never Smoker  . Smokeless tobacco: Never Used  Substance and Sexual Activity  . Alcohol use: No  . Drug use: No  . Sexual activity: Never  Lifestyle  . Physical activity:    Days per week: Not on file    Minutes per session: Not on file  . Stress: Not on file  Relationships  . Social connections:    Talks on phone: Not on file    Gets together: Not on file    Attends religious service: Not on file   Active member of club or organization: Not on file    Attends meetings of clubs or organizations: Not on file    Relationship status: Not on file  . Intimate partner violence:    Fear of current or ex partner: Not on file    Emotionally abused: Not on file    Physically abused: Not on file    Forced sexual activity: Not on file  Other Topics Concern  . Not on file  Social History Narrative   4 grown children   Widowed   Retired housewife   Completed 5th grade in [place], she is literate in her native language   Lives with grandson, his wife and their daughter   No pets.    Past Surgical History:  Procedure Laterality Date  . BREAST LUMPECTOMY Left 05/13/2017   Procedure: LEFT BREAST LUMPECTOMY;  Surgeon: Autumn Messing III, MD;  Location: [facility];  Service: General;  Laterality: Left;  . clot removal Right 2006   Pt states she had a blood clot removed from her temple about 12 years ago.  Marland Kitchen EYE SURGERY Bilateral    cataract surgery  Family History  Problem Relation Age of Onset  . Diabetes Father     No Known Allergies  Current Outpatient Medications on File Prior to Visit  Medication Sig Dispense Refill  . alendronate (FOSAMAX) 70 MG tablet Take 1 tablet (70 mg total) by mouth every 7 (seven) days. Take with a full glass of water on an empty stomach. (Patient taking differently: Take 70 mg by mouth every Saturday. Take with a full glass of water on an empty stomach.) 4 tablet 11  . amLODipine (NORVASC) 5 MG tablet Take 1 tablet (5 mg total) by mouth daily. 30 tablet 3  . aspirin (ECOTRIN LOW STRENGTH) 81 MG EC tablet Take 81 mg by mouth daily. Swallow whole.    . Calcium Carbonate-Vitamin D (CALTRATE 600+D) 600-400 MG-UNIT tablet Take 1 tablet by mouth 2 (two) times daily.    Marland Kitchen HYDROcodone-acetaminophen (NORCO/VICODIN) 5-325 MG tablet Take 1-2 tablets by mouth every 6 (six) hours as needed for moderate pain or severe pain. 15 tablet 0  . Multiple Vitamin (MULTIVITAMIN WITH  MINERALS) TABS tablet Take 1 tablet by mouth daily.    Marland Kitchen PRESCRIPTION MEDICATION Place 1 drop into both eyes at bedtime. DuoTrav : Travatan and Timolol    . losartan-hydrochlorothiazide (HYZAAR) 50-12.5 MG tablet Take 1 tablet by mouth daily.     No current facility-administered medications on file prior to visit.     BP (!) 125/52 (BP Location: Right Arm, Patient Position: Sitting, Cuff Size: Normal)   Pulse 79   Resp 16   Ht 4\' 11"  (1.499 m)   Wt 104 lb 6.4 oz (47.4 kg)   SpO2 98%   BMI 21.09 kg/m    Objective:   Physical Exam  Constitutional: She is oriented to person, place, and time. She appears well-developed and well-nourished.  Cardiovascular: Normal rate, regular rhythm and normal heart sounds.  No murmur heard. Pulmonary/Chest: Effort normal and breath sounds normal. No respiratory distress. She has no wheezes.  Musculoskeletal: She exhibits no edema.  Neurological: She is alert and oriented to person, place, and time.  Psychiatric: She has a normal mood and affect. Her behavior is normal. Judgment and thought content normal.          Assessment & Plan:  HTN- BP stable here today. Continue current meds. Obtain follow up bmet.  Osteoporosis- clinically stable. Continue fosamax, walking, caltrate.  Breast Cancer- she has one more week of radiation and has been tolerating.

## 2017-07-20 ENCOUNTER — Ambulatory Visit
Admission: RE | Admit: 2017-07-20 | Discharge: 2017-07-20 | Disposition: A | Discharge status: Home / Self Care | Payer: Medicaid Other | Source: Ambulatory Visit | Attending: Radiation Oncology | Admitting: Radiation Oncology

## 2017-07-20 DIAGNOSIS — Z510 Encounter for antineoplastic radiation therapy: Secondary | ICD-10-CM | POA: Diagnosis not present

## 2017-07-21 ENCOUNTER — Ambulatory Visit: Payer: Medicaid Other | Admitting: Hematology and Oncology

## 2017-07-21 ENCOUNTER — Ambulatory Visit
Admission: RE | Admit: 2017-07-21 | Discharge: 2017-07-21 | Disposition: A | Discharge status: Home / Self Care | Payer: Medicaid Other | Source: Ambulatory Visit | Attending: Radiation Oncology | Admitting: Radiation Oncology

## 2017-07-21 DIAGNOSIS — Z510 Encounter for antineoplastic radiation therapy: Secondary | ICD-10-CM | POA: Diagnosis not present

## 2017-07-21 NOTE — Assessment & Plan Note (Deleted)
05/13/17:Left Lumpectomy: IDC Grade 2, 3.3 cm with DCIS, PNI and PVI present T2N0 ER 100%, PR 30%, Ki-67 10%, HER-2 negative ratio 1.54 Adjuvant radiation therapy 06/28/2017-07/21/2017  Treatment plan: Adjuvant antiestrogen therapy with letrozole 2.5 mg daily  We discussed the risks and benefits of anti-estrogen therapy with aromatase inhibitors. These include but not limited to insomnia, hot flashes, mood changes, vaginal dryness, bone density loss, and weight gain. We strongly believe that the benefits far outweigh the risks. Patient understands these risks and consented to starting treatment. Planned treatment duration is 5 years.  Return to clinic in 3 months with survivorship care plan visit

## 2017-07-21 NOTE — Progress Notes (Deleted)
Patient Care Team: [name], NP as PCP - General (Internal Medicine)  DIAGNOSIS:  Encounter Diagnosis  Name Primary?  . Malignant neoplasm of upper-inner quadrant of left breast in female, estrogen receptor positive ([place])     SUMMARY OF ONCOLOGIC HISTORY:   Malignant neoplasm of upper-inner quadrant of left breast in female, estrogen receptor positive ([place])   04/06/2017 Initial Diagnosis    Left breast UIQ spiculated mass 2.2 x 1.4 x 2 cm, 5 cm from the nipple, no axillary nodes, biopsy revealed IDC with DCIS grade 1-2, ER 100%, PR 30%, Ki-67 10%, HER-2 negative ratio 1.54, T2 N0 stage Ib clinical stage      05/13/2017 Surgery    IDC Grade 2, 3.3 cm with DCIS, PNI and PVI present T2N0 ER 100%, PR 30%, Ki-67 10%, HER-2 negative ratio 1.54      06/28/2017 - 07/21/2017 Radiation Therapy    Adjuvant radiation therapy       CHIEF COMPLIANT: Follow-up after radiation therapy  INTERVAL HISTORY: Tanya Sparks is a 84 year old with above-mentioned history left breast cancer was treated with lumpectomy followed by adjuvant radiation.  She is here today to discuss additional adjuvant treatment options.  She has some radiation dermatitis   REVIEW OF SYSTEMS:   Constitutional: Denies fevers, chills or abnormal weight loss Eyes: Denies blurriness of vision Ears, nose, mouth, throat, and face: Denies mucositis or sore throat Respiratory: Denies cough, dyspnea or wheezes Cardiovascular: Denies palpitation, chest discomfort Gastrointestinal:  Denies nausea, heartburn or change in bowel habits Skin: Denies abnormal skin rashes Lymphatics: Denies new lymphadenopathy or easy bruising Neurological:Denies numbness, tingling or new weaknesses Behavioral/Psych: Mood is stable, no new changes  Extremities: No lower extremity edema Breast: Radiation dermatitis All other systems were reviewed with the patient and are negative.  I have reviewed the past medical history, past surgical  history, social history and family history with the patient and they are unchanged from previous note.  ALLERGIES:  has No Known Allergies.  MEDICATIONS:  Current Outpatient Medications  Medication Sig Dispense Refill  . alendronate (FOSAMAX) 70 MG tablet Take 1 tablet (70 mg total) by mouth every 7 (seven) days. Take with a full glass of water on an empty stomach. (Patient taking differently: Take 70 mg by mouth every Saturday. Take with a full glass of water on an empty stomach.) 4 tablet 11  . amLODipine (NORVASC) 5 MG tablet Take 1 tablet (5 mg total) by mouth daily. 90 tablet 1  . aspirin (ECOTRIN LOW STRENGTH) 81 MG EC tablet Take 81 mg by mouth daily. Swallow whole.    . Calcium Carbonate-Vitamin D (CALTRATE 600+D) 600-400 MG-UNIT tablet Take 1 tablet by mouth 2 (two) times daily.    Marland Kitchen losartan-hydrochlorothiazide (HYZAAR) 50-12.5 MG tablet Take 1 tablet by mouth daily. 90 tablet 1  . Multiple Vitamin (MULTIVITAMIN WITH MINERALS) TABS tablet Take 1 tablet by mouth daily.    Marland Kitchen PRESCRIPTION MEDICATION Place 1 drop into both eyes at bedtime. DuoTrav : Travatan and Timolol     No current facility-administered medications for this visit.     PHYSICAL EXAMINATION: ECOG PERFORMANCE STATUS: 1 - Symptomatic but completely ambulatory  There were no vitals filed for this visit. There were no vitals filed for this visit.  GENERAL:alert, no distress and comfortable SKIN: skin color, texture, turgor are normal, no rashes or significant lesions EYES: normal, Conjunctiva are pink and non-injected, sclera clear OROPHARYNX:no exudate, no erythema and lips, buccal mucosa, and tongue normal  NECK:  supple, thyroid normal size, non-tender, without nodularity LYMPH:  no palpable lymphadenopathy in the cervical, axillary or inguinal LUNGS: clear to auscultation and percussion with normal breathing effort HEART: regular rate & rhythm and no murmurs and no lower extremity edema ABDOMEN:abdomen soft,  non-tender and normal bowel sounds MUSCULOSKELETAL:no cyanosis of digits and no clubbing  NEURO: alert & oriented x 3 with fluent speech, no focal motor/sensory deficits EXTREMITIES: No lower extremity edema  LABORATORY DATA:  I have reviewed the data as listed CMP Latest Ref Rng & Units 07/19/2017 05/10/2017 03/15/2017  Glucose 70 - 99 mg/dL 119(H) 90 96  BUN 6 - 23 mg/dL 12 12 10   Creatinine 0.40 - 1.20 mg/dL 0.59 0.65 0.65  Sodium 135 - 145 mEq/L 132(L) 134(L) 136  Potassium 3.5 - 5.1 mEq/L 3.9 4.0 4.6  Chloride 96 - 112 mEq/L 96 100(L) 100  CO2 19 - 32 mEq/L 31 28 29   Calcium 8.4 - 10.5 mg/dL 9.6 9.5 9.9  Total Protein 6.0 - 8.3 g/dL - - 7.5  Total Bilirubin 0.2 - 1.2 mg/dL - - 0.3  Alkaline Phos 39 - 117 U/L - - 57  AST 0 - 37 U/L - - 19  ALT 0 - 35 U/L - - 11    Lab Results  Component Value Date   WBC 5.8 05/10/2017   HGB 12.5 05/10/2017   HCT 36.6 05/10/2017   MCV 88.4 05/10/2017   PLT 285 05/10/2017    ASSESSMENT & PLAN:  Malignant neoplasm of upper-inner quadrant of left breast in female, estrogen receptor positive ([facility]) 05/13/17:Left Lumpectomy: IDC Grade 2, 3.3 cm with DCIS, PNI and PVI present T2N0 ER 100%, PR 30%, Ki-67 10%, HER-2 negative ratio 1.54 Adjuvant radiation therapy 06/28/2017-07/21/2017  Treatment plan: Adjuvant antiestrogen therapy with letrozole 2.5 mg daily  We discussed the risks and benefits of anti-estrogen therapy with aromatase inhibitors. These include but not limited to insomnia, hot flashes, mood changes, vaginal dryness, bone density loss, and weight gain. We strongly believe that the benefits far outweigh the risks. Patient understands these risks and consented to starting treatment. Planned treatment duration is 5 years.  Return to clinic in 3 months with survivorship care plan visit  No orders of the defined types were placed in this encounter.  The patient has a good understanding of the overall plan. she agrees with it. she will call  with any problems that may develop before the next visit here.   Harriette Ohara, MD 07/21/17

## 2017-07-22 ENCOUNTER — Ambulatory Visit
Admission: RE | Admit: 2017-07-22 | Discharge: 2017-07-22 | Disposition: A | Discharge status: Home / Self Care | Payer: Medicaid Other | Source: Ambulatory Visit | Attending: Radiation Oncology | Admitting: Radiation Oncology

## 2017-07-22 DIAGNOSIS — Z510 Encounter for antineoplastic radiation therapy: Secondary | ICD-10-CM | POA: Diagnosis not present

## 2017-07-23 ENCOUNTER — Ambulatory Visit
Admission: RE | Admit: 2017-07-23 | Discharge: 2017-07-23 | Disposition: A | Discharge status: Home / Self Care | Payer: Medicaid Other | Source: Ambulatory Visit | Attending: Radiation Oncology | Admitting: Radiation Oncology

## 2017-07-23 ENCOUNTER — Encounter: Payer: Self-pay | Admitting: Radiation Oncology

## 2017-07-23 DIAGNOSIS — Z510 Encounter for antineoplastic radiation therapy: Secondary | ICD-10-CM | POA: Diagnosis not present

## 2017-07-30 ENCOUNTER — Telehealth: Payer: Self-pay | Admitting: Hematology and Oncology

## 2017-07-30 NOTE — Telephone Encounter (Signed)
Spoke to patients son regarding upcoming April appointments per 4/10 sch message.

## 2017-07-30 NOTE — Progress Notes (Addendum)
  Radiation Oncology         [phone] ________________________________  Name: Tanya Sparks MRN: [id number]  Date: 07/23/2017  DOB: [birth year]  End of Treatment Note  Diagnosis:   StageIBclinical stage, leftBreastUIQ Invasive Ductal Carcinoma, ER100%/ PR 30%/ Her2 negative, Grade1-2  Cancer Staging Malignant neoplasm of upper-inner quadrant of left breast in female, estrogen receptor positive ([place]) Staging form: Breast, AJCC 8th Edition - Clinical: Stage IB (cT2, cN0, cM0, G2, ER: Positive, PR: Positive, HER2: Negative) - Unsigned  Pathologic stage T2 Nx  Indication for treatment:  Curative      Radiation treatment dates:   06/28/2017 to 07/23/2017  Site/dose:    1) Left Breast / 40.05 Gy in 15 fractions of 2.67 Gy 2) Left Breast Boost / 10 Gy in 5 fractions of 2 Gy  Beams/energy:   1) 3D  / 10, 6MV 2) en face / 6MeV  Narrative: The patient tolerated radiation treatment relatively well.   She denies pain. She is using radiaplex as directed. She developed skin darkening with radiation treatment.  Plan: The patient has completed radiation treatment. The patient will return to radiation oncology clinic for routine followup in one month. I advised them to call or return sooner if they have any questions or concerns related to their recovery or treatment.  -----------------------------------  Eppie Gibson, MD   This document serves as a record of services personally performed by Eppie Gibson, MD. It was created on her behalf by Arlyce Harman, a trained medical scribe. The creation of this record is based on the scribe's personal observations and the provider's statements to them. This document has been checked and approved by the attending provider.

## 2017-08-02 ENCOUNTER — Telehealth: Payer: Self-pay | Admitting: Hematology and Oncology

## 2017-08-02 ENCOUNTER — Inpatient Hospital Stay: Payer: Medicaid Other | Attending: Hematology and Oncology | Admitting: Hematology and Oncology

## 2017-08-02 DIAGNOSIS — Z170 Estrogen receptor positive status [ER+]: Secondary | ICD-10-CM | POA: Insufficient documentation

## 2017-08-02 DIAGNOSIS — C50212 Malignant neoplasm of upper-inner quadrant of left female breast: Secondary | ICD-10-CM

## 2017-08-02 DIAGNOSIS — Z79811 Long term (current) use of aromatase inhibitors: Secondary | ICD-10-CM | POA: Diagnosis not present

## 2017-08-02 DIAGNOSIS — Z7982 Long term (current) use of aspirin: Secondary | ICD-10-CM | POA: Diagnosis not present

## 2017-08-02 DIAGNOSIS — Z923 Personal history of irradiation: Secondary | ICD-10-CM | POA: Insufficient documentation

## 2017-08-02 DIAGNOSIS — C50412 Malignant neoplasm of upper-outer quadrant of left female breast: Secondary | ICD-10-CM | POA: Diagnosis not present

## 2017-08-02 MED ORDER — LETROZOLE 2.5 MG PO TABS: 2.5000 mg | ORAL_TABLET | Freq: Every day | ORAL | 3 refills | Status: AC

## 2017-08-02 MED FILL — LETROZOLE 2.5 MG TABLET: 2.5 | 30 days supply | Qty: 30 | Fill #0

## 2017-08-02 NOTE — Progress Notes (Signed)
Patient Care Team: [name], NP as PCP - General (Internal Medicine)  DIAGNOSIS:  Encounter Diagnosis  Name Primary?  . Malignant neoplasm of upper-inner quadrant of left breast in female, estrogen receptor positive ([place])     SUMMARY OF ONCOLOGIC HISTORY:   Malignant neoplasm of upper-inner quadrant of left breast in female, estrogen receptor positive ([place])   04/06/2017 Initial Diagnosis    Left breast UIQ spiculated mass 2.2 x 1.4 x 2 cm, 5 cm from the nipple, no axillary nodes, biopsy revealed IDC with DCIS grade 1-2, ER 100%, PR 30%, Ki-67 10%, HER-2 negative ratio 1.54, T2 N0 stage Ib clinical stage      05/13/2017 Surgery    IDC Grade 2, 3.3 cm with DCIS, PNI and PVI present T2N0 ER 100%, PR 30%, Ki-67 10%, HER-2 negative ratio 1.54      06/28/2017 - 07/23/2017 Radiation Therapy    Adjuvant radiation therapy       CHIEF COMPLIANT: Follow-up after radiation therapy to discuss adjuvant hormone therapy plan  INTERVAL HISTORY: Tanya Sparks is a 84 year old with above-mentioned history left breast cancer treated with lumpectomy and she just completed adjuvant radiation therapy.  She is here today to discuss adjuvant treatment plan with antiestrogen therapy.  Previously she was very reluctant to go on any further adjuvant treatments after surgery.  However she decided to receive radiation therapy.  She is here today accompanied by her family to discuss whether there are any benefits to antiestrogen therapy at her age.  REVIEW OF SYSTEMS:   Constitutional: Denies fevers, chills or abnormal weight loss Eyes: Denies blurriness of vision Ears, nose, mouth, throat, and face: Denies mucositis or sore throat Respiratory: Denies cough, dyspnea or wheezes Cardiovascular: Denies palpitation, chest discomfort Gastrointestinal:  Denies nausea, heartburn or change in bowel habits Skin: Denies abnormal skin rashes Lymphatics: Denies new lymphadenopathy or easy  bruising Neurological:Denies numbness, tingling or new weaknesses Behavioral/Psych: Mood is stable, no new changes  Extremities: No lower extremity edema  All other systems were reviewed with the patient and are negative.  I have reviewed the past medical history, past surgical history, social history and family history with the patient and they are unchanged from previous note.  ALLERGIES:  has No Known Allergies.  MEDICATIONS:  Current Outpatient Medications  Medication Sig Dispense Refill  . alendronate (FOSAMAX) 70 MG tablet Take 1 tablet (70 mg total) by mouth every 7 (seven) days. Take with a full glass of water on an empty stomach. (Patient taking differently: Take 70 mg by mouth every Saturday. Take with a full glass of water on an empty stomach.) 4 tablet 11  . amLODipine (NORVASC) 5 MG tablet Take 1 tablet (5 mg total) by mouth daily. 90 tablet 1  . aspirin (ECOTRIN LOW STRENGTH) 81 MG EC tablet Take 81 mg by mouth daily. Swallow whole.    . Calcium Carbonate-Vitamin D (CALTRATE 600+D) 600-400 MG-UNIT tablet Take 1 tablet by mouth 2 (two) times daily.    Marland Kitchen losartan-hydrochlorothiazide (HYZAAR) 50-12.5 MG tablet Take 1 tablet by mouth daily. 90 tablet 1  . Multiple Vitamin (MULTIVITAMIN WITH MINERALS) TABS tablet Take 1 tablet by mouth daily.    Marland Kitchen PRESCRIPTION MEDICATION Place 1 drop into both eyes at bedtime. DuoTrav : Travatan and Timolol     No current facility-administered medications for this visit.     PHYSICAL EXAMINATION: ECOG PERFORMANCE STATUS: 1 - Symptomatic but completely ambulatory  There were no vitals filed for this visit. There were  no vitals filed for this visit.  GENERAL:alert, no distress and comfortable SKIN: skin color, texture, turgor are normal, no rashes or significant lesions EYES: normal, Conjunctiva are pink and non-injected, sclera clear OROPHARYNX:no exudate, no erythema and lips, buccal mucosa, and tongue normal  NECK: supple, thyroid normal  size, non-tender, without nodularity LYMPH:  no palpable lymphadenopathy in the cervical, axillary or inguinal LUNGS: clear to auscultation and percussion with normal breathing effort HEART: regular rate & rhythm and no murmurs and no lower extremity edema ABDOMEN:abdomen soft, non-tender and normal bowel sounds MUSCULOSKELETAL:no cyanosis of digits and no clubbing  NEURO: alert & oriented x 3 with fluent speech, no focal motor/sensory deficits EXTREMITIES: No lower extremity edema   LABORATORY DATA:  I have reviewed the data as listed CMP Latest Ref Rng & Units 07/19/2017 05/10/2017 03/15/2017  Glucose 70 - 99 mg/dL 119(H) 90 96  BUN 6 - 23 mg/dL _0 Creatinine 0.40 - 1.20 mg/dL 0.59 0.65 0.65  Sodium 135 - 145 mEq/L 132(L) 134(L) 136  Potassium 3.5 - 5.1 mEq/L 3.9 4.0 4.6  Chloride 96 - 112 mEq/L 96 100(L) 100  CO2 19 - 32 mEq/L _1 Calcium 8.4 - 10.5 mg/dL 9.6 9.5 9.9  Total Protein 6.0 - 8.3 g/dL - - 7.5  Total Bilirubin 0.2 - 1.2 mg/dL - - 0.3  Alkaline Phos 39 - 117 U/L - - 57  AST 0 - 37 U/L - - 19  ALT 0 - 35 U/L - - 11    Lab Results  Component Value Date   WBC 5.8 05/10/2017   HGB 12.5 05/10/2017   HCT 36.6 05/10/2017   MCV 88.4 05/10/2017   PLT 285 05/10/2017    ASSESSMENT & PLAN:  Malignant neoplasm of upper-inner quadrant of left breast in female, estrogen receptor positive ([facility]) 05/13/17:Left Lumpectomy: IDC Grade 2, 3.3 cm with DCIS, PNI and PVI present T2N0 ER 100%, PR 30%, Ki-67 10%, HER-2 negative ratio 1.54  Adjuvant radiation 06/28/2017-07/23/2017  Treatment plan: Adjuvant antiestrogen therapy with letrozole 2.5 mg daily.  Letrozole counseling: We discussed the risks and benefits of anti-estrogen therapy with aromatase inhibitors. These include but not limited to insomnia, hot flashes, mood changes, vaginal dryness, bone density loss, and weight gain. We strongly believe that the benefits far outweigh the risks. Patient understands these risks and  consented to starting treatment. Planned treatment duration is 5 years.  Return to clinic in 3 months for toxicity check in survivorship care plan was    No orders of the defined types were placed in this encounter.  The patient has a good understanding of the overall plan. she agrees with it. she will call with any problems that may develop before the next visit here.   Harriette Ohara, MD 08/02/17

## 2017-08-02 NOTE — Assessment & Plan Note (Signed)
05/13/17:Left Lumpectomy: IDC Grade 2, 3.3 cm with DCIS, PNI and PVI present T2N0 ER 100%, PR 30%, Ki-67 10%, HER-2 negative ratio 1.54  Adjuvant radiation 06/28/2017-07/23/2017  Treatment plan: Adjuvant antiestrogen therapy with letrozole 2.5 mg daily.  Letrozole counseling: We discussed the risks and benefits of anti-estrogen therapy with aromatase inhibitors. These include but not limited to insomnia, hot flashes, mood changes, vaginal dryness, bone density loss, and weight gain. We strongly believe that the benefits far outweigh the risks. Patient understands these risks and consented to starting treatment. Planned treatment duration is 5 years.  Return to clinic in 3 months for toxicity check in survivorship care plan was

## 2017-08-02 NOTE — Telephone Encounter (Signed)
Gave avs and calendar ° °

## 2017-08-04 ENCOUNTER — Encounter: Payer: Self-pay | Admitting: *Deleted

## 2017-08-17 MED FILL — AMLODIPINE BESYLATE 5 MG TA: 5 | 90 days supply | Qty: 90 | Fill #1

## 2017-08-17 MED FILL — ALENDRONATE NA 70 MG TAB: 70 | 84 days supply | Qty: 12 | Fill #5

## 2017-08-17 MED FILL — LOSARTAN POTASSIUM-HCTZ 50-: 50-12.5 | 90 days supply | Qty: 90 | Fill #1

## 2017-08-19 ENCOUNTER — Encounter: Payer: Self-pay | Admitting: Radiation Oncology

## 2017-08-19 NOTE — Progress Notes (Signed)
error 

## 2017-08-24 ENCOUNTER — Ambulatory Visit
Admission: RE | Admit: 2017-08-24 | Discharge: 2017-08-24 | Disposition: A | Discharge status: Home / Self Care | Payer: Medicaid Other | Source: Ambulatory Visit | Attending: Radiation Oncology | Admitting: Radiation Oncology

## 2017-08-24 HISTORY — DX: Personal history of irradiation: Z92.3

## 2017-08-25 ENCOUNTER — Telehealth: Payer: Self-pay | Admitting: *Deleted

## 2017-08-25 NOTE — Telephone Encounter (Signed)
CALLED PATIENT TO ASK ABOUT RESCHEDULING MISSED FU FOR 08-24-17, LVM FOR A RETURN CALL

## 2017-08-30 ENCOUNTER — Telehealth: Payer: Self-pay | Admitting: *Deleted

## 2017-08-30 MED FILL — LETROZOLE 2.5 MG TABLET: 2.5 | 90 days supply | Qty: 90 | Fill #1

## 2017-08-30 NOTE — Telephone Encounter (Signed)
CALLED PATIENT TO RESCHEDULE MISSED FU APPT. FOR 08-24-17, LVM FOR A RETURN CALL

## 2017-10-27 ENCOUNTER — Telehealth: Payer: Self-pay

## 2017-10-27 NOTE — Telephone Encounter (Signed)
LVM reminding of SCP visit with Tanya Sparks on 11/05/17 at 10 am. Left center number for call back with questions.

## 2017-11-05 ENCOUNTER — Encounter: Payer: Medicaid Other | Admitting: Adult Health

## 2017-11-16 MED FILL — LOSARTAN-HCTZ 50-12.5 MG TA: 50-12.5 | 56 days supply | Qty: 56 | Fill #2

## 2017-11-16 MED FILL — AMLODIPINE BESYLATE 5 MG TA: 5 | 56 days supply | Qty: 56 | Fill #2

## 2017-11-16 MED FILL — ALENDRONATE NA 70 MG TAB: 70 | 84 days supply | Qty: 12 | Fill #6

## 2017-11-17 ENCOUNTER — Telehealth: Payer: Self-pay | Admitting: *Deleted

## 2017-11-17 NOTE — Telephone Encounter (Signed)
Received fax from [facility] requesting refills of losartan hctz 50/12.5mg .  Refill sent on 07/19/17, #90 x 1 refill which should last until next appt is due. Pt is due for 6 month follow up in October.  Please call pt to schedule appt for then and refills can be given at her follow up if needed. Thanks!

## 2017-11-19 NOTE — Telephone Encounter (Signed)
Called pt and scheduled appt for 01/21/18

## 2017-12-03 MED FILL — LETROZOLE 2.5 MG TABLET: 2.5 | 90 days supply | Qty: 90 | Fill #2

## 2018-01-21 ENCOUNTER — Ambulatory Visit: Payer: Medicaid Other | Admitting: Family

## 2018-02-01 ENCOUNTER — Inpatient Hospital Stay: Payer: Medicaid Other | Attending: Hematology and Oncology | Admitting: Hematology and Oncology

## 2018-02-01 NOTE — Assessment & Plan Note (Deleted)
05/13/17:Left Lumpectomy:IDC Grade 2, 3.3 cm with DCIS, PNI and PVI present T2N0 ER 100%, PR 30%, Ki-67 10%, HER-2 negative ratio 1.54  Adjuvant radiation 06/28/2017-07/23/2017  Treatment plan: Adjuvant antiestrogen therapy with letrozole 2.5 mg daily.  Letrozole toxicities:  Breast cancer surveillance: 1.  Breast exam July 2019: Benign 2. mammogram will be done December 2019  Return to clinic in 1 year for follow-up

## 2019-07-18 IMAGING — MG MM CLIP PLACEMENT
2 series · 2 of 2 positions shown · non-contrast
Comparison: Previous exam(s).

CLINICAL DATA: Evaluate clip placement following ultrasound-guided
left breast biopsy.

EXAM:
DIAGNOSTIC LEFT MAMMOGRAM POST ULTRASOUND BIOPSY

[L CC]
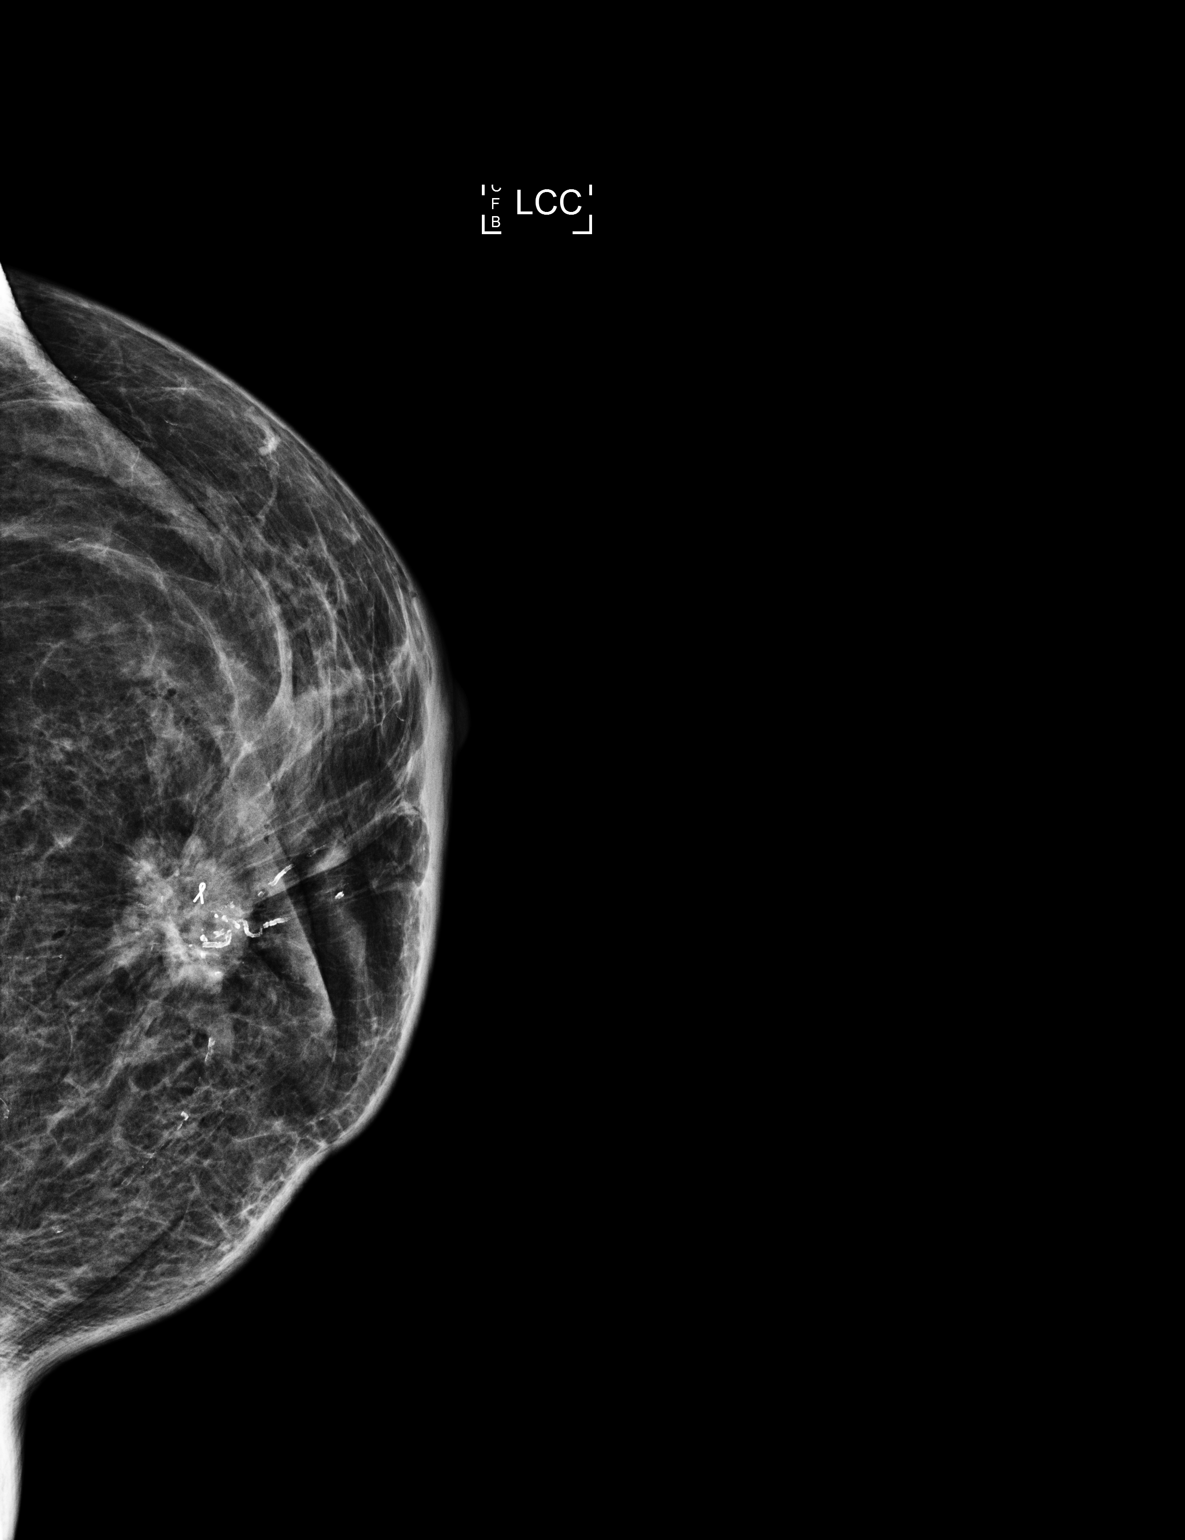

[L ML]
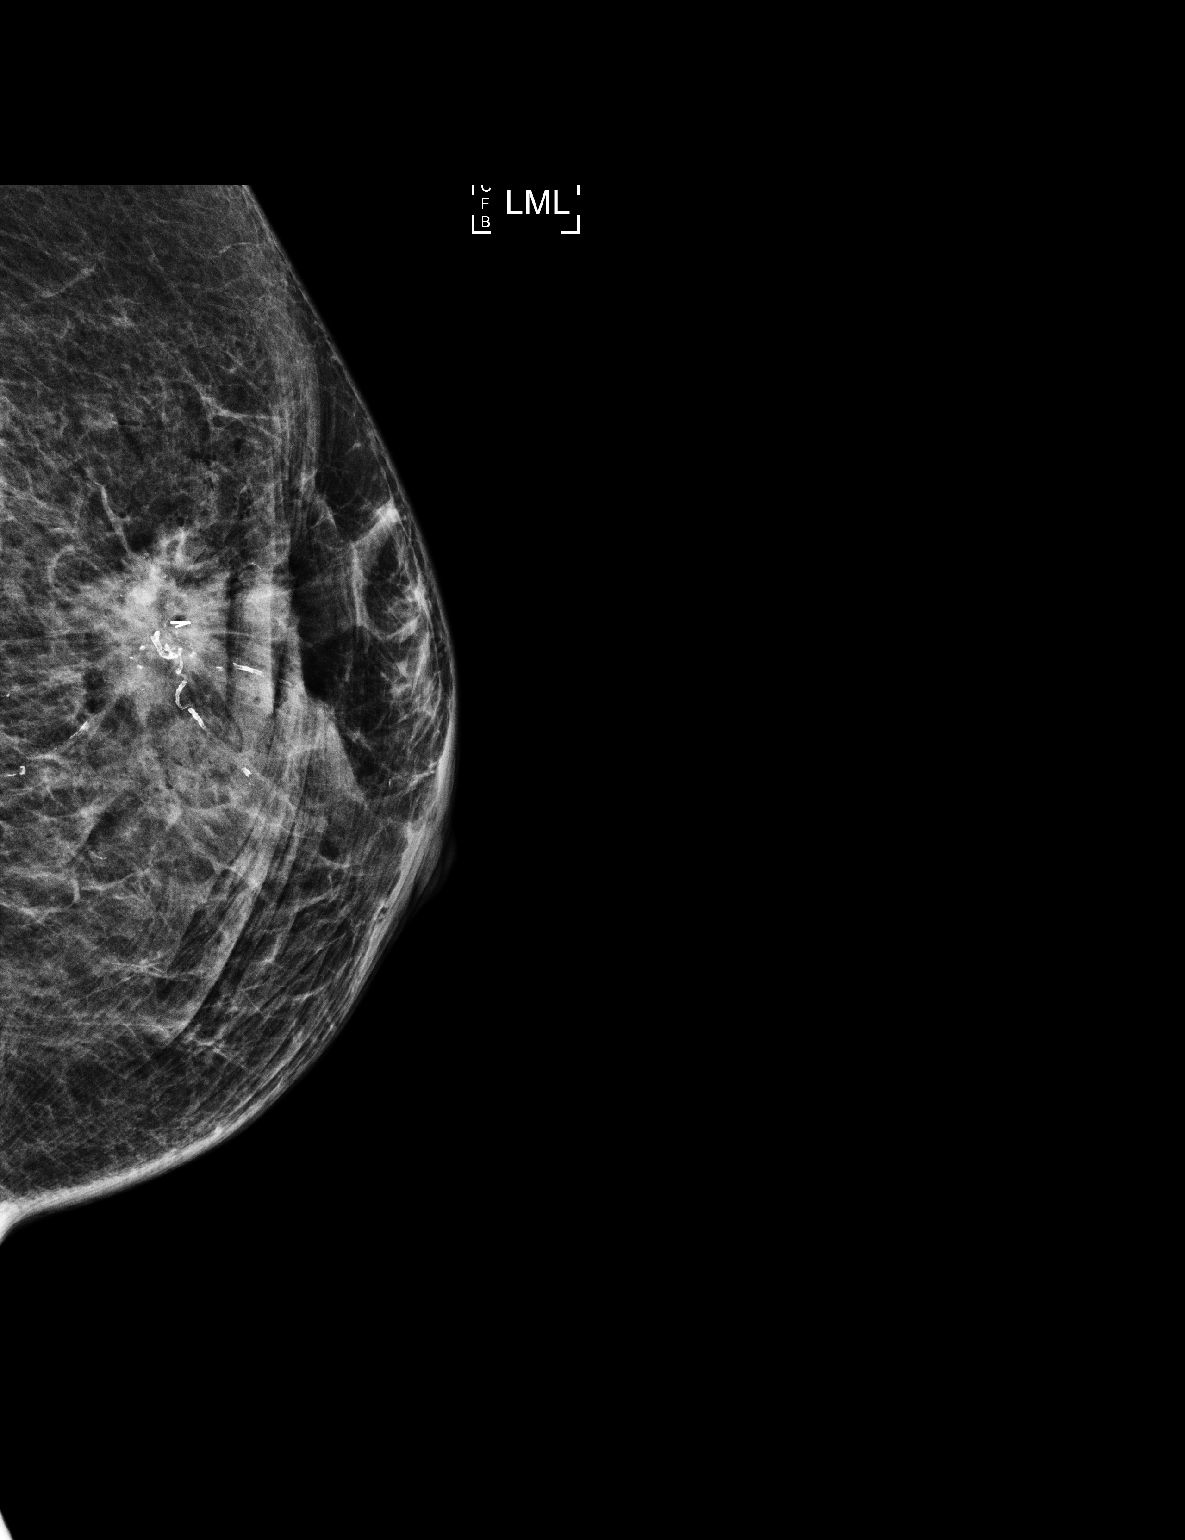

[2 of 2 positions shown; findings below may reference images not displayed]

FINDINGS: Mammographic images were obtained following ultrasound guided biopsy
of the 2.2 cm mass at the [DATE] position of the left breast.

The ribbon shaped clip is in satisfactory position.
IMPRESSION: Satisfactory ribbon clip placement following ultrasound-guided left
breast biopsy.

Final Assessment: Post Procedure Mammograms for Marker Placement

## 2020-06-18 DEATH — deceased
